# Patient Record
Sex: Female | Born: 1970
Health system: Southern US, Community
[De-identification: ages and names within clinical notes are randomized; demographics above are authoritative.]

## PROBLEM LIST (undated history)

## (undated) DIAGNOSIS — IMO0001 Reserved for inherently not codable concepts without codable children: Secondary | ICD-10-CM

## (undated) DIAGNOSIS — G43909 Migraine, unspecified, not intractable, without status migrainosus: Secondary | ICD-10-CM

## (undated) DIAGNOSIS — R002 Palpitations: Secondary | ICD-10-CM

## (undated) DIAGNOSIS — L7211 Pilar cyst: Secondary | ICD-10-CM

## (undated) DIAGNOSIS — M1990 Unspecified osteoarthritis, unspecified site: Secondary | ICD-10-CM

## (undated) DIAGNOSIS — D259 Leiomyoma of uterus, unspecified: Secondary | ICD-10-CM

## (undated) DIAGNOSIS — N3010 Interstitial cystitis (chronic) without hematuria: Secondary | ICD-10-CM

## (undated) DIAGNOSIS — D649 Anemia, unspecified: Secondary | ICD-10-CM

## (undated) DIAGNOSIS — Z87442 Personal history of urinary calculi: Secondary | ICD-10-CM

## (undated) DIAGNOSIS — E559 Vitamin D deficiency, unspecified: Secondary | ICD-10-CM

## (undated) DIAGNOSIS — I341 Nonrheumatic mitral (valve) prolapse: Principal | ICD-10-CM

## (undated) DIAGNOSIS — E039 Hypothyroidism, unspecified: Secondary | ICD-10-CM

## (undated) DIAGNOSIS — N920 Excessive and frequent menstruation with regular cycle: Secondary | ICD-10-CM

## (undated) HISTORY — DX: Palpitations: R00.2

## (undated) HISTORY — DX: Hypothyroidism, unspecified: E03.9

## (undated) HISTORY — DX: Nonrheumatic mitral (valve) prolapse: I34.1

## (undated) HISTORY — PX: BUNIONECTOMY: SHX129

## (undated) HISTORY — PX: CYSTOSCOPY: SUR368

## (undated) HISTORY — DX: Interstitial cystitis (chronic) without hematuria: N30.10

---

## 1997-06-18 ENCOUNTER — Other Ambulatory Visit: Admission: RE | Admit: 1997-06-18 | Discharge: 1997-06-18 | Payer: Self-pay | Admitting: Obstetrics and Gynecology

## 1999-01-02 ENCOUNTER — Inpatient Hospital Stay (HOSPITAL_COMMUNITY): Admission: AD | Admit: 1999-01-02 | Discharge: 1999-01-05 | Payer: Self-pay | Admitting: Obstetrics and Gynecology

## 1999-02-04 ENCOUNTER — Other Ambulatory Visit: Admission: RE | Admit: 1999-02-04 | Discharge: 1999-02-04 | Payer: Self-pay | Admitting: Obstetrics & Gynecology

## 2000-02-19 ENCOUNTER — Other Ambulatory Visit: Admission: RE | Admit: 2000-02-19 | Discharge: 2000-02-19 | Payer: Self-pay | Admitting: Obstetrics and Gynecology

## 2001-01-14 ENCOUNTER — Other Ambulatory Visit: Admission: RE | Admit: 2001-01-14 | Discharge: 2001-01-14 | Payer: Self-pay | Admitting: Obstetrics & Gynecology

## 2001-08-16 ENCOUNTER — Inpatient Hospital Stay (HOSPITAL_COMMUNITY): Admission: AD | Admit: 2001-08-16 | Discharge: 2001-08-18 | Payer: Self-pay | Admitting: Obstetrics & Gynecology

## 2002-04-20 ENCOUNTER — Other Ambulatory Visit: Admission: RE | Admit: 2002-04-20 | Discharge: 2002-04-20 | Payer: Self-pay | Admitting: Obstetrics & Gynecology

## 2003-04-24 ENCOUNTER — Other Ambulatory Visit: Admission: RE | Admit: 2003-04-24 | Discharge: 2003-04-24 | Payer: Self-pay | Admitting: Obstetrics & Gynecology

## 2004-04-29 ENCOUNTER — Other Ambulatory Visit: Admission: RE | Admit: 2004-04-29 | Discharge: 2004-04-29 | Payer: Self-pay | Admitting: Obstetrics & Gynecology

## 2008-02-03 HISTORY — PX: WISDOM TOOTH EXTRACTION: SHX21

## 2009-10-31 ENCOUNTER — Ambulatory Visit (HOSPITAL_BASED_OUTPATIENT_CLINIC_OR_DEPARTMENT_OTHER): Admission: RE | Admit: 2009-10-31 | Discharge: 2009-10-31 | Payer: Self-pay | Admitting: Urology

## 2010-02-01 ENCOUNTER — Emergency Department (HOSPITAL_BASED_OUTPATIENT_CLINIC_OR_DEPARTMENT_OTHER)
Admission: EM | Admit: 2010-02-01 | Discharge: 2010-02-02 | Payer: Self-pay | Source: Home / Self Care | Admitting: Emergency Medicine

## 2010-02-02 HISTORY — PX: FINGER SURGERY: SHX640

## 2010-06-20 NOTE — Op Note (Signed)
[facility]  Patient:    Theresa Schwartz, Theresa Schwartz Visit Number: [id number] MRN: [id number]          Service Type: OBS Location: 910A 9140 01 Attending Physician:  Mickle Mallory Dictated by:   Gerrit Friends. Aldona Bar, M.D. Proc. Date: 08/16/01 Admit Date:  08/16/2001 Discharge Date: 08/18/2001                             Operative Report  PREOPERATIVE DIAGNOSES:       Repeat cesarean section at term.  POSTOPERATIVE DIAGNOSES:      Repeat cesarean section at term, delivery of 7 pound 9 ounce female infant, Apgars 8 and 9.  PROCEDURE:                    Repeat low transverse cesarean section.  SURGEON:                      Gerrit Friends. Aldona Bar, M.D.  ANESTHESIA:                   Subarachnoid block--Dr. Harvest Forest.  HISTORY:                      This 40 year old gravida 2, para 1 at term is now being taken to the operating room for a repeat cesarean section having had her first cesarean section over two years ago.  A recent ultrasound showed a fetal weight of approximately 7.5-8 pounds with increased amniotic fluid volume and at least one nuchal cord.  PROCEDURE:                    The patient was taken to the operating room where after the satisfactory induction of a subarachnoid block by Dr. Harvest Forest she was prepped and draped having been placed in the supine position slightly tilted to the left.  A Foley catheter was inserted as part of the prep.  After she was adequately draped, anesthetic levels were checked and found to be adequate.  At this time procedure was begun.  A Pfannenstiel incision was made, dissecting down to and through the old scar with ease.  This was carried down to and through the fascia in a low transverse fashion as well with hemostasis created at each layer.  Subfascial space was created inferiorly and superiorly.  Muscles separated in the midline.  Peritoneum identified and entered appropriately with care taken to avoid the bowel superiorly  and the bladder inferiorly.  At this time the vesicouterine peritoneum was incised in a low transverse fashion and pushed off the lower uterine segment with ease.  Sharp incision to the uterus with the Metzenbaum scissors was then carried out and extended with the fingers laterally.  At this time the amniotomy was produced with the production of a voluminous amount of clear amniotic fluid.  Thereafter with the aid of the vacuum extractor a viable female infant weighing 7 pounds 9 ounces was delivered from a vertex position.  After the cord was clamped and cut the infant was passed off the awaiting team.  Subsequent Apgars noted to be 8 and 9 and the infant was taken to the nursery in good condition.  After the cord bloods were collected the placenta was delivered intact.  The uterus was left in and good uterine contractility was afford with slowly given intravenous Pitocin and manual stimulation.  The cavity was  rendered free of any remaining products of conception.  At this time the uterine incision was closed with a single layer of #1 Vicryl in a running locking fashion.  This was reinforced with several figure-of-eight #1 Vicryl.  Hemostasis noted to be adequate.  With the uterus well contracted and the uterine incision dry and all counts being correct and no foreign bodies noted to be remaining in the abdominal cavity, the abdomen at this time was closed in layers.  The abdominoperitoneum was closed with 0 Vicryl in a running fashion and muscles secured with same.  Assured of good subfascial hemostasis, the fascia was then reapproximated with 0 Vicryl from angle to midline bilaterally.  Assured of good subcutaneous hemostasis, the skin was then closed with staples and a sterile pressure dressing was applied.  The patient was transported to the recovery room in satisfactory condition having tolerated procedure well. Estimated blood loss 500 cc.  All counts correct x2.  At the conclusion  of the procedure both mother and baby were doing well in their respective recovery areas. Dictated by:   Gerrit Friends. Aldona Bar, M.D. Attending Physician:  Mickle Mallory DD:  08/16/01 TD:  08/18/01 Job: [id number]

## 2010-06-20 NOTE — Op Note (Signed)
[facility]  Patient:    Theresa Schwartz                        MRN: [id number] Proc. Date: 01/02/99 Adm. Date:  [id number] Attending:  Mickle Mallory                           Operative Report  PREOPERATIVE DIAGNOSIS:       Nonreassuring fetal heart rate tracing.  POSTOPERATIVE DIAGNOSIS:      Nonreassuring fetal heart rate tracing.  Plus delivery of 8 pound 5 ounce female infant, Apgars 7 and 9.  Arterial cord pH 7.21.  OPERATION:                    Primary low transverse cesarean section.  SURGEON:                      Gerrit Friends. Aldona Bar, M.D.  ASSISTANT:  ANESTHESIA:                   Epidural.  ESTIMATED BLOOD LOSS:  INDICATIONS:                  This 40 year old primigravida was admitted at term in labor with rupture of membranes.  When she was admitted around noon on November 30, she was approximately 6 cm dilated and in good active labor.  When I checked her for the first time at approximately 12:30 p.m. she was 7 cm with a vertex at -2  station and the vertex did not fit the cervix well.  Bag of forewaters was ruptured with production of clear fluid and fetal heart rate at this time was noted to be reactive.  The patient did request an epidural which was placed later in the afternoon.  Because of what appeared to be a dysfunctional pattern once the patient became 8 cm and remained at 8 cm for approximately an hour or so, Pitocin augmentation was added.  I was called at approximately 4:35 and notified that there was a decelleration in the fetal heart rate into the 60s and was asked to come o labor and delivery stat.  When I arrived the fetal heart rate had rebounded into the 140 range.  My vaginal examination found the patient to be approximately 8 m dilated with a vertex again at -2 station and again the vertex not fitting the cervix well.  The fetal heart rate at this time had returned to the 140 level.  Because of the  nonreassuring fetal heart change likely resulting from a cord and the fact that the patient was not progressing well and likewise was having an arrest disorder of both descent and dilatation, the decision was made to proceed expediently to the operating room for delivery.  DESCRIPTION OF PROCEDURE:     The patient was taken to the operating room where her epidural was augmented.  Fetal heart rate remained between 150 and 160.  A Foley catheter had been inserted prior to her arrival in the operating room and initially appeared to have blood-tinged urine within.  Nonetheless, after the patient was  prepped and draped with good anesthesia on board, the procedure was begun.  A Pfannenstiel incision was made and with minimal difficulty dissected down sharply to and through the fascia with ease with hemostasis created at each layer.  The  fascia was incised in  a low transverse fashion and thereafter the subfascial space was created inferiorly and superiorly, the muscles separated in the midline, the peritoneum identified and opened appropriately with care taken to avoid the bowel superiorly and the bladder inferiorly.  At this time, the vesicouterine peritoneum was incised in a low transverse fashion and the bladder pushed off the lower uterine segment with ease.  Sharp incision to the uterus with the Metzenbaum scissors was then carried out in a low transverse fashion and extended laterally with the fingers.  With the aid of a vacuum extractor, a viable female infant was  then delivered from a vertex position.  There was a nuchal cord.  The infant cried spontaneously upon delivery.  The infant was handed off to the awaiting team headed up by [place] Sink Q. Mikle Bosworth, M.D. after the cord was clamped and cut.  Cord blood was ent and returned at 7.21 and Apgars were noted to be 7 and 9.  The infant was taken to the nursery in good condition.  After the cord bloods were collected, the placenta  was delivered intact.  At this time the uterus was delivered from the abdominal incision and rendered free of ny remaining products of conception and good contractility afforded with slowly-given intravenous Pitocin and manual stimulation.  At this time the uterine incision as closed with a single layer of #1 Vicryl in a running locking fashion and reinforced with several figure-of-eight #1 Vicryl sutures for additional hemostasis.  The uterine incision was then noted to be hemostatic.  The tubes and ovaries were noted to be normal.  The uterus at this time was replaced into the abdomen after the abdomen was lavaged of all free blood and clot.  At this time closure of the abdomen was begun in layers after being assured that the counts were correct and no foreign bodies were noted to be remaining in the abdominal cavity.  The abdominal peritoneum was closed with 0 Vicryl in a running fashion and the muscles secured with the same.  Assured of good subfascial hemostasis, the fascia was then reapproximated using 0 Vicryl from angle to midline bilaterally.  The subcu tissues were then rendered hemostatic and staples were then used to close the skin.  A sterile pressure dressing was applied and the patient thereafter taken to the recovery room in satisfactory condition having tolerated the procedure well. Estimated blood loss was 500 cc.  Sponge, needle, and instrument counts were correct x 2.  The patients urine was clearing on arrival in the recovery room.  On the patients arrival in the recovery room, both she and baby were doing well in their respective areas.  In summary, this patient who probably was going to develop an arrest disorder of descent and dilatation actually developed a nonreassuring fetal heart rate tracing probably secondary to a nuchal cord and was taken to the operating room for low  transverse cesarean section with delivery of an 8 pound 5 ounce female  infant with Apgars of 7 and 9 and arterial cord pH 7.21. DD:  01/02/99 TD:  01/04/99 Job: [id number]

## 2010-06-20 NOTE — Discharge Summary (Signed)
[facility]  Patient:    Theresa Schwartz, Theresa Schwartz Visit Number: [id number] MRN: [id number]          Service Type: OBS Location: 910A 9140 01 Attending Physician:  Mickle Mallory Dictated by:   Gerrit Friends. Aldona Bar, M.D. Admit Date:  08/16/2001 Discharge Date: 08/18/2001                             Discharge Summary  DISCHARGE DIAGNOSES: 1. Term pregnancy delivered, 7 pound 9 ounce female infant, Apgars 8 and 9. 2. Blood type O+.  PROCEDURES:  Repeat cesarean section at term.  HOSPITAL COURSE:  This 40 year old gravida 2, para 1, underwent a repeat cesarean section at term on 08/16/01, with delivery of a 7 pound 9 ounce female infant with Apgars of 8 and 9.  Her postoperative/postpartum course was benign.  Her discharge hemoglobin was 11.1 with a white blood cell count of 9600, and a platelet count of 214,000.  On the morning of 08/18/01, she was desirous of discharge.  Her wound was clean and dry.  Her staples were removed, and wound was steri-striped.  She was ambulating well, tolerating a regular diet well, having normal bowel and bladder function, was afebrile, vital signs were stable, and her breast-feeding was going well.  Accordingly, she was given all appropriate instructions, and understood all instructions well.  DISCHARGE MEDICATIONS: 1. Vitamins one q.d. as long as she is breast-feeding. 2. Motrin 600 mg q.6h. p.r.n. pain. 3. Tylox one or two q.4-6h. p.r.n. severe pain.  FOLLOWUP:  In approximately four weeks in the office.  CONDITION ON DISCHARGE:  Improved. Dictated by:   Gerrit Friends. Aldona Bar, M.D. Attending Physician:  Mickle Mallory DD:  08/18/01 TD:  08/22/01 Job: [id number]

## 2010-07-08 ENCOUNTER — Other Ambulatory Visit: Payer: Self-pay | Admitting: Gastroenterology

## 2010-07-08 ENCOUNTER — Ambulatory Visit
Admission: RE | Admit: 2010-07-08 | Discharge: 2010-07-08 | Disposition: A | Discharge status: Home / Self Care | Payer: BC Managed Care – PPO | Source: Ambulatory Visit | Attending: Gastroenterology | Admitting: Gastroenterology

## 2010-07-08 DIAGNOSIS — R1011 Right upper quadrant pain: Secondary | ICD-10-CM

## 2010-07-15 ENCOUNTER — Ambulatory Visit
Admission: RE | Admit: 2010-07-15 | Discharge: 2010-07-15 | Disposition: A | Discharge status: Home / Self Care | Payer: BC Managed Care – PPO | Source: Ambulatory Visit | Attending: Gastroenterology | Admitting: Gastroenterology

## 2010-07-15 ENCOUNTER — Other Ambulatory Visit: Payer: Self-pay | Admitting: Gastroenterology

## 2012-12-26 ENCOUNTER — Encounter: Payer: Self-pay | Admitting: Interventional Cardiology

## 2012-12-26 ENCOUNTER — Ambulatory Visit (INDEPENDENT_AMBULATORY_CARE_PROVIDER_SITE_OTHER): Payer: BC Managed Care – PPO | Admitting: Interventional Cardiology

## 2012-12-26 VITALS — BP 120/80 | HR 78 | Ht 64.0 in | Wt 159.0 lb

## 2012-12-26 DIAGNOSIS — N3010 Interstitial cystitis (chronic) without hematuria: Secondary | ICD-10-CM

## 2012-12-26 DIAGNOSIS — R002 Palpitations: Secondary | ICD-10-CM | POA: Insufficient documentation

## 2012-12-26 DIAGNOSIS — E039 Hypothyroidism, unspecified: Secondary | ICD-10-CM

## 2012-12-26 DIAGNOSIS — I059 Rheumatic mitral valve disease, unspecified: Secondary | ICD-10-CM

## 2012-12-26 DIAGNOSIS — I341 Nonrheumatic mitral (valve) prolapse: Secondary | ICD-10-CM

## 2012-12-26 HISTORY — DX: Palpitations: R00.2

## 2012-12-26 HISTORY — DX: Interstitial cystitis (chronic) without hematuria: N30.10

## 2012-12-26 HISTORY — DX: Hypothyroidism, unspecified: E03.9

## 2012-12-26 HISTORY — DX: Nonrheumatic mitral (valve) prolapse: I34.1

## 2012-12-26 NOTE — Patient Instructions (Signed)
Your physician recommends that you continue on your current medications as directed. Please refer to the Current Medication list given to you today.  Your physician wants you to follow-up in: 6 months. You will receive a reminder letter in the mail two months in advance. If you don't receive a letter, please call our office to schedule the follow-up appointment.  

## 2012-12-26 NOTE — Progress Notes (Signed)
Patient ID: Theresa Schwartz, female   DOB: 1970-12-02, 42 y.o.   MRN: [id number]    [address], [address] [place], [place]  [address] Phone: [phone] Fax:  [phone]  Date:  12/26/2012   ID:  Theresa Schwartz, DOB 1970/07/06, MRN [id number]  PCP:  Dr. Carol Ada   ASSESSMENT:  1. Mitral valve prolapse, syndrome with intermittent palpitations, chest pain, fatigue, and other complaints 2. Palpitations, previously documented to be bursts of SVT 3. Hypothyroidism 4. History of interstitial cystitis  PLAN:  1. We discussed the mitral valve prolapse syndrome. We discussed management of hypersensitivity to catecholamines. She uses the beta blocker sparingly. I would not use sedation. I encouraged aerobic activity. No specific further workup at this time. 2. Six-month clinical followup   SUBJECTIVE: Theresa Schwartz is a 42 y.o. female who has a history of "mitral valve prolapse". Multiple echoes by Dr. Luberta Robertson in [facility] has documented the presence of this lesion. There is apparent mild mitral regurgitation. Approximately 4 years ago the diagnosis became known upon evaluation of chest pain, palpitations, and fatigue. She was given low-dose beta blocker therapy which is used intermittently since that time. At least 3 echocardiograms have been performed and have been interpreted to show mild mitral valve prolapse with mild regurgitation. According to the patient she is normal long and been relatively asymptomatic until 2 weeks ago when she suddenly developed a sense of increased palpitations, heart pounding, left chest pain for which she has had prior evaluation, and chest pressure. The pressure in her chest lasted 2 days and has subsequently resolved. She was active during this time. There was some shortness of breath. Minimal activity causes an increased awareness of her heart beating. She has not had sustained tachycardia, lightheadedness, dizziness, or syncope. There is no orthopnea or  PND.   Wt Readings from Last 3 Encounters:  12/26/12 159 lb (72.122 kg)     Past Medical History  Diagnosis Date  . Interstitial cystitis 12/26/2012    Followed by Dr. Jacquelyne Balint  . Hypothyroidism 12/26/2012    replacement therapy  . Mitral valve prolapse syndrome 12/26/2012    Current Outpatient Prescriptions  Medication Sig Dispense Refill  . levothyroxine (SYNTHROID, LEVOTHROID) 25 MCG tablet Take 25 mcg by mouth daily.      . metoprolol tartrate (LOPRESSOR) 25 MG tablet Take 25 mg by mouth as needed.       No current facility-administered medications for this visit.    Allergies:    Allergies  Allergen Reactions  . Sulfa Antibiotics     Social History:  The patient  reports that she has quit smoking. She does not have any smokeless tobacco history on file. She reports that she does not drink alcohol or use illicit drugs.   ROS:  Please see the history of present illness.   Anxiety, difficulty sleeping, there is neurological complaints including leg weakness arm weakness and other. Sedentary lifestyle.   All other systems reviewed and negative.   OBJECTIVE: VS:  BP 120/80  Pulse 78  Ht 5\' 4"  (1.626 m)  Wt 159 lb (72.122 kg)  BMI 27.28 kg/m2 Well nourished, well developed, in no acute distress, mildly overweight HEENT: normal Neck: JVD flat. Carotid bruit absent  Cardiac:  normal S1, S2; RRR; no murmur Lungs:  clear to auscultation bilaterally, no wheezing, rhonchi or rales Abd: soft, nontender, no hepatomegaly Ext: Edema absent. Pulses 2+ Skin: warm and dry Neuro:  CNs 2-12 intact, no focal abnormalities  noted  EKG:  Normal sinus rhythm with insignificant inferior Q waves, vertical axis, and nonspecific interventricular conduction delay       Signed, Darci Needle III, MD 12/26/2012 10:54 AM

## 2013-02-20 ENCOUNTER — Ambulatory Visit
Admission: RE | Admit: 2013-02-20 | Discharge: 2013-02-20 | Disposition: A | Discharge status: Home / Self Care | Payer: BC Managed Care – PPO | Source: Ambulatory Visit | Attending: Internal Medicine | Admitting: Internal Medicine

## 2013-02-20 ENCOUNTER — Other Ambulatory Visit: Payer: Self-pay | Admitting: Internal Medicine

## 2013-02-20 DIAGNOSIS — M5412 Radiculopathy, cervical region: Secondary | ICD-10-CM

## 2013-03-18 ENCOUNTER — Emergency Department (HOSPITAL_BASED_OUTPATIENT_CLINIC_OR_DEPARTMENT_OTHER)
Admission: EM | Admit: 2013-03-18 | Discharge: 2013-03-18 | Disposition: A | Discharge status: Home / Self Care | Payer: BC Managed Care – PPO | Attending: Emergency Medicine | Admitting: Emergency Medicine

## 2013-03-18 ENCOUNTER — Encounter (HOSPITAL_BASED_OUTPATIENT_CLINIC_OR_DEPARTMENT_OTHER): Payer: Self-pay | Admitting: Emergency Medicine

## 2013-03-18 ENCOUNTER — Emergency Department (HOSPITAL_BASED_OUTPATIENT_CLINIC_OR_DEPARTMENT_OTHER): Payer: BC Managed Care – PPO

## 2013-03-18 DIAGNOSIS — E039 Hypothyroidism, unspecified: Secondary | ICD-10-CM | POA: Insufficient documentation

## 2013-03-18 DIAGNOSIS — R110 Nausea: Secondary | ICD-10-CM | POA: Insufficient documentation

## 2013-03-18 DIAGNOSIS — Z8679 Personal history of other diseases of the circulatory system: Secondary | ICD-10-CM | POA: Insufficient documentation

## 2013-03-18 DIAGNOSIS — R350 Frequency of micturition: Secondary | ICD-10-CM | POA: Insufficient documentation

## 2013-03-18 DIAGNOSIS — R109 Unspecified abdominal pain: Secondary | ICD-10-CM | POA: Insufficient documentation

## 2013-03-18 DIAGNOSIS — Z87891 Personal history of nicotine dependence: Secondary | ICD-10-CM | POA: Insufficient documentation

## 2013-03-18 DIAGNOSIS — Z3202 Encounter for pregnancy test, result negative: Secondary | ICD-10-CM | POA: Insufficient documentation

## 2013-03-18 DIAGNOSIS — M549 Dorsalgia, unspecified: Secondary | ICD-10-CM | POA: Insufficient documentation

## 2013-03-18 DIAGNOSIS — R319 Hematuria, unspecified: Secondary | ICD-10-CM | POA: Insufficient documentation

## 2013-03-18 DIAGNOSIS — Z79899 Other long term (current) drug therapy: Secondary | ICD-10-CM | POA: Insufficient documentation

## 2013-03-18 DIAGNOSIS — R3915 Urgency of urination: Secondary | ICD-10-CM | POA: Insufficient documentation

## 2013-03-18 HISTORY — DX: Reserved for inherently not codable concepts without codable children: IMO0001

## 2013-03-18 LAB — BASIC METABOLIC PANEL
BUN: 21 mg/dL (ref 6–23)
CALCIUM: 9.3 mg/dL (ref 8.4–10.5)
CO2: 25 meq/L (ref 19–32)
Chloride: 102 mEq/L (ref 96–112)
Creatinine, Ser: 0.6 mg/dL (ref 0.50–1.10)
GFR calc Af Amer: >90 mL/min (ref >90)
GFR calc non Af Amer: >90 mL/min (ref >90)
GLUCOSE: 91 mg/dL (ref 70–99)
Potassium: 4.1 mEq/L (ref 3.7–5.3)
SODIUM: 140 meq/L (ref 137–147)

## 2013-03-18 LAB — CBC WITH DIFFERENTIAL/PLATELET
Basophils Absolute: 0 10*3/uL (ref 0.0–0.1)
Basophils Relative: 0 % (ref 0–1)
EOS ABS: 0.3 10*3/uL (ref 0.0–0.7)
EOS PCT: 3 % (ref 0–5)
HEMATOCRIT: 41.4 % (ref 36.0–46.0)
HEMOGLOBIN: 14.3 g/dL (ref 12.0–15.0)
LYMPHS ABS: 2.3 10*3/uL (ref 0.7–4.0)
LYMPHS PCT: 26 % (ref 12–46)
MCH: 31.9 pg (ref 26.0–34.0)
MCHC: 34.5 g/dL (ref 30.0–36.0)
MCV: 92.4 fL (ref 78.0–100.0)
MONOS PCT: 7 % (ref 3–12)
Monocytes Absolute: 0.6 10*3/uL (ref 0.1–1.0)
Neutro Abs: 5.5 10*3/uL (ref 1.7–7.7)
Neutrophils Relative %: 64 % (ref 43–77)
PLATELETS: 309 10*3/uL (ref 150–400)
RBC: 4.48 MIL/uL (ref 3.87–5.11)
RDW: 12.1 % (ref 11.5–15.5)
WBC: 8.7 10*3/uL (ref 4.0–10.5)

## 2013-03-18 LAB — URINALYSIS, ROUTINE W REFLEX MICROSCOPIC
BILIRUBIN URINE: NEGATIVE
Glucose, UA: NEGATIVE mg/dL
KETONES UR: NEGATIVE mg/dL
Leukocytes, UA: NEGATIVE
NITRITE: NEGATIVE
PROTEIN: NEGATIVE mg/dL
Specific Gravity, Urine: 1.008 (ref 1.005–1.030)
UROBILINOGEN UA: 0.2 mg/dL (ref 0.0–1.0)
pH: 7 (ref 5.0–8.0)

## 2013-03-18 LAB — URINE MICROSCOPIC-ADD ON

## 2013-03-18 LAB — PREGNANCY, URINE: Preg Test, Ur: NEGATIVE

## 2013-03-18 MED ORDER — NITROFURANTOIN MONOHYD MACRO 100 MG PO CAPS: 100.0000 mg | ORAL_CAPSULE | Freq: Two times a day (BID) | ORAL | Status: DC

## 2013-03-18 MED ORDER — KETOROLAC TROMETHAMINE 30 MG/ML IJ SOLN
30.0000 mg | Freq: Once | INTRAMUSCULAR | Status: AC
  Administered 2013-03-18: 30 mg via INTRAVENOUS
  Filled 2013-03-18: qty 1

## 2013-03-18 MED ORDER — SODIUM CHLORIDE 0.9 % IV SOLN
INTRAVENOUS | Status: AC
  Administered 2013-03-18: 15:00:00 via INTRAVENOUS

## 2013-03-18 MED ORDER — HYDROCODONE-ACETAMINOPHEN 5-325 MG PO TABS: 1.0000 | ORAL_TABLET | ORAL | Status: DC | PRN

## 2013-03-18 MED ORDER — ONDANSETRON HCL 4 MG/2ML IJ SOLN
4.0000 mg | Freq: Once | INTRAMUSCULAR | Status: AC
  Administered 2013-03-18: 4 mg via INTRAVENOUS
  Filled 2013-03-18: qty 2

## 2013-03-18 MED ORDER — PHENAZOPYRIDINE HCL 200 MG PO TABS: 200.0000 mg | ORAL_TABLET | Freq: Three times a day (TID) | ORAL | Status: DC

## 2013-03-18 NOTE — Discharge Instructions (Signed)
Your CT scan today does not show a kidney stone. You may have passed a stone or they may be other reasons for the blood in your urine. Your blood work today is normal. We have sent your urine for culture to be sure there is no hidden infection. We are giving you the name of a kidney doctor to follow up with.  Return here as needed.

## 2013-03-18 NOTE — ED Notes (Signed)
C/o bilateral flank pain, onset on Wednesday.  Pain radiates around to her mid abdomen.  C/o nausea, denies vomiting/diarrhea, fever.  Seen at [facility] last night, they referred her for continued eval if pain continued.

## 2013-03-18 NOTE — ED Provider Notes (Signed)
CSN: [id number]     Arrival date & time 03/18/13  73 History   First MD Initiated Contact with Patient 03/18/13 1426     Chief Complaint  Patient presents with  . Flank Pain     (Consider location/radiation/quality/duration/timing/severity/associated sxs/prior Treatment) Patient is a 43 y.o. female presenting with flank pain. The history is provided by the patient.  Flank Pain This is a new problem. The current episode started in the past 7 days. The problem has been gradually worsening. Associated symptoms include abdominal pain and nausea. Pertinent negatives include no chills, fever, headaches, rash or vomiting. She has tried nothing for the symptoms.   LAGENA STRAND is a 43 y.o. female who presents to the ED with bilateral flank pain that started 4 days ago. The pain radiates from the lower back to the right lower abdomen. She complains of nausea but no vomiting or diarrhea and no fever. She went to [facility] last night and they suggested she come to the ED for further evaluation of possible kidney stone if the pain continued. The pain has continued. PMH significant for interstitial cystitis.    Past Medical History  Diagnosis Date  . Interstitial cystitis 12/26/2012    Followed by Dr. Vikki Ports  . Hypothyroidism 12/26/2012    replacement therapy  . Mitral valve prolapse syndrome 12/26/2012  . Other physical therapy     for loss of curvature in her cervical spine   Past Surgical History  Procedure Laterality Date  . Cesarean section    . Cystoscopy     Family History  Problem Relation Age of Onset  . Hypertension Father    History  Substance Use Topics  . Smoking status: Former Research scientist (life sciences)  . Smokeless tobacco: Not on file  . Alcohol Use: No   OB History   Grav Para Term Preterm Abortions TAB SAB Ect Mult Living                 Review of Systems  Constitutional: Negative for fever and chills.  HENT: Negative.   Eyes: Negative for visual disturbance.   Respiratory: Negative for shortness of breath.   Gastrointestinal: Positive for nausea and abdominal pain. Negative for vomiting.  Genitourinary: Positive for urgency, frequency and flank pain. Negative for dysuria.  Musculoskeletal: Positive for back pain.  Skin: Negative for rash.  Neurological: Negative for syncope and headaches.  Psychiatric/Behavioral: The patient is not nervous/anxious.       Allergies  Cymbalta; Sulfa antibiotics; and Tramadol  Home Medications   Current Outpatient Rx  Name  Route  Sig  Dispense  Refill  . levothyroxine (SYNTHROID, LEVOTHROID) 25 MCG tablet   Oral   Take 25 mcg by mouth daily.         . metoprolol tartrate (LOPRESSOR) 25 MG tablet   Oral   Take 25 mg by mouth as needed.          BP 120/82  Pulse 100  Temp(Src) 98.5 F (36.9 C) (Oral)  Resp 12  Ht 5\' 3"  (1.6 m)  Wt 160 lb (72.576 kg)  BMI 28.35 kg/m2  SpO2 99%  LMP 03/09/2013 Physical Exam  Nursing note and vitals reviewed. Constitutional: She is oriented to person, place, and time. She appears well-developed and well-nourished. No distress.  HENT:  Head: Normocephalic and atraumatic.  Eyes: Conjunctivae and EOM are normal.  Neck: Neck supple.  Cardiovascular: Normal rate.   Pulmonary/Chest: Effort normal.  Abdominal: Soft. Bowel sounds are normal. There is  tenderness in the suprapubic area. There is no rebound, no guarding and no CVA tenderness.  Musculoskeletal: Normal range of motion.  Neurological: She is alert and oriented to person, place, and time. No cranial nerve deficit.  Skin: Skin is warm and dry.  Psychiatric: She has a normal mood and affect. Her behavior is normal.    ED Course  Procedures  Results for orders placed during the hospital encounter of 03/18/13 (from the past 24 hour(s))  URINALYSIS, ROUTINE W REFLEX MICROSCOPIC     Status: Abnormal   Collection Time    03/18/13  1:26 PM      Result Value Ref Range   Color, Urine YELLOW  YELLOW    APPearance CLEAR  CLEAR   Specific Gravity, Urine 1.008  1.005 - 1.030   pH 7.0  5.0 - 8.0   Glucose, UA NEGATIVE  NEGATIVE mg/dL   Hgb urine dipstick MODERATE (*) NEGATIVE   Bilirubin Urine NEGATIVE  NEGATIVE   Ketones, ur NEGATIVE  NEGATIVE mg/dL   Protein, ur NEGATIVE  NEGATIVE mg/dL   Urobilinogen, UA 0.2  0.0 - 1.0 mg/dL   Nitrite NEGATIVE  NEGATIVE   Leukocytes, UA NEGATIVE  NEGATIVE  PREGNANCY, URINE     Status: None   Collection Time    03/18/13  1:26 PM      Result Value Ref Range   Preg Test, Ur NEGATIVE  NEGATIVE  URINE MICROSCOPIC-ADD ON     Status: Abnormal   Collection Time    03/18/13  1:26 PM      Result Value Ref Range   Squamous Epithelial / LPF RARE  RARE   RBC / HPF 3-6  <3 RBC/hpf   Bacteria, UA FEW (*) RARE  CBC WITH DIFFERENTIAL     Status: None   Collection Time    03/18/13  3:15 PM      Result Value Ref Range   WBC 8.7  4.0 - 10.5 K/uL   RBC 4.48  3.87 - 5.11 MIL/uL   Hemoglobin 14.3  12.0 - 15.0 g/dL   HCT 41.4  36.0 - 46.0 %   MCV 92.4  78.0 - 100.0 fL   MCH 31.9  26.0 - 34.0 pg   MCHC 34.5  30.0 - 36.0 g/dL   RDW 12.1  11.5 - 15.5 %   Platelets 309  150 - 400 K/uL   Neutrophils Relative % 64  43 - 77 %   Neutro Abs 5.5  1.7 - 7.7 K/uL   Lymphocytes Relative 26  12 - 46 %   Lymphs Abs 2.3  0.7 - 4.0 K/uL   Monocytes Relative 7  3 - 12 %   Monocytes Absolute 0.6  0.1 - 1.0 K/uL   Eosinophils Relative 3  0 - 5 %   Eosinophils Absolute 0.3  0.0 - 0.7 K/uL   Basophils Relative 0  0 - 1 %   Basophils Absolute 0.0  0.0 - 0.1 K/uL  BASIC METABOLIC PANEL     Status: None   Collection Time    03/18/13  3:15 PM      Result Value Ref Range   Sodium 140  137 - 147 mEq/L   Potassium 4.1  3.7 - 5.3 mEq/L   Chloride 102  96 - 112 mEq/L   CO2 25  19 - 32 mEq/L   Glucose, Bld 91  70 - 99 mg/dL   BUN 21  6 - 23 mg/dL   Creatinine, Ser 0.60  0.50 - 1.10 mg/dL   Calcium 9.3  8.4 - 10.5 mg/dL   GFR calc non Af Amer >90  >90 mL/min   GFR calc Af Amer  >90  >90 mL/min    Ct Abdomen Pelvis Wo Contrast  03/18/2013   CLINICAL DATA:  Bilateral flank pain, nausea and hematuria.  EXAM: CT ABDOMEN AND PELVIS WITHOUT CONTRAST  TECHNIQUE: Multidetector CT imaging of the abdomen and pelvis was performed following the standard protocol without IV contrast.  COMPARISON:  CT ABD/PELV WO CM dated 07/15/2010; CT ABD-PELV WO/W CM dated 10/09/2009  FINDINGS: No renal or ureteral calculi identified. There is no evidence of hydronephrosis. The bladder is completely decompressed.  Unenhanced appearance of the liver, gallbladder, spleen, pancreas, adrenal glands and bowel are unremarkable. The uterus and adnexal regions are unremarkable in the pelvis.  No incidental masses, enlarged lymph nodes or abnormal fluid collections are seen. No hernias are identified. Bony structures are unremarkable.  IMPRESSION: No acute findings.  No evidence of renal or ureteral calculi.   Electronically Signed   By: Aletta Edouard M.D.   On: 03/18/2013 15:28    Patient states she feels the need to void every 5 minutes and feels like she does when she is getting a UTI. She request antibiotic while UC pending.  MDM  43 y.o. female with hematuria and UTI symptoms. Will treat with Macrobid while culture pending. She will make an appointment with her urologist for follow up. She will return here as needed for problems.  I have reviewed this patient's vital signs, nurses notes, appropriate labs and imaging.  I have discussed findings and plan of care with the patient. She agrees to plan.    Medication List    TAKE these medications       HYDROcodone-acetaminophen 5-325 MG per tablet  Commonly known as:  NORCO/VICODIN  Take 1 tablet by mouth every 4 (four) hours as needed.     nitrofurantoin (macrocrystal-monohydrate) 100 MG capsule  Commonly known as:  MACROBID  Take 1 capsule (100 mg total) by mouth 2 (two) times daily.     phenazopyridine 200 MG tablet  Commonly known as:  PYRIDIUM   Take 1 tablet (200 mg total) by mouth 3 (three) times daily.      ASK your doctor about these medications       levothyroxine 25 MCG tablet  Commonly known as:  SYNTHROID, LEVOTHROID  Take 25 mcg by mouth daily.     metoprolol tartrate 25 MG tablet  Commonly known as:  LOPRESSOR  Take 25 mg by mouth as needed.           [facility] Bunnie Pion, [place] 03/19/13 [phone]

## 2013-03-19 NOTE — ED Provider Notes (Signed)
Medical screening examination/treatment/procedure(s) were performed by non-physician practitioner and as supervising physician I was immediately available for consultation/collaboration.  EKG Interpretation   None         Yeny Schmoll B. Jaishawn Witzke, MD 03/19/13 1305 

## 2013-03-20 LAB — URINE CULTURE
CULTURE: NO GROWTH
Colony Count: NO GROWTH

## 2013-03-28 ENCOUNTER — Ambulatory Visit: Payer: BC Managed Care – PPO | Admitting: Interventional Cardiology

## 2013-07-19 ENCOUNTER — Ambulatory Visit (INDEPENDENT_AMBULATORY_CARE_PROVIDER_SITE_OTHER): Payer: BC Managed Care – PPO | Admitting: Interventional Cardiology

## 2013-07-19 ENCOUNTER — Encounter: Payer: Self-pay | Admitting: Interventional Cardiology

## 2013-07-19 VITALS — BP 104/68 | HR 66 | Ht 64.0 in | Wt 161.0 lb

## 2013-07-19 DIAGNOSIS — I341 Nonrheumatic mitral (valve) prolapse: Secondary | ICD-10-CM

## 2013-07-19 DIAGNOSIS — I059 Rheumatic mitral valve disease, unspecified: Secondary | ICD-10-CM

## 2013-07-19 DIAGNOSIS — R002 Palpitations: Secondary | ICD-10-CM

## 2013-07-19 NOTE — Progress Notes (Signed)
Patient ID: Theresa Schwartz, female   DOB: [birth year], 43 y.o.   MRN: [id number]    [address], [address] [place], [place]  [address] Phone: [phone] Fax:  [phone]  Date:  07/19/2013   ID:  Theresa Schwartz, DOB February 12, 1970, MRN [id number]  PCP:  No primary provider on file.   ASSESSMENT:  1. Mitral valve prolapse syndrome, minimally symptomatic  PLAN:  1. Clinical followup in one year   SUBJECTIVE: Theresa Schwartz is a 43 y.o. female who has minimal symptoms. The   Wt Readings from Last 3 Encounters:  07/19/13 161 lb (73.029 kg)  03/18/13 160 lb (72.576 kg)  12/26/12 159 lb (72.122 kg)     Past Medical History  Diagnosis Date  . Interstitial cystitis 12/26/2012    Followed by Dr. Vikki Ports  . Hypothyroidism 12/26/2012    replacement therapy  . Mitral valve prolapse syndrome 12/26/2012  . Other physical therapy     for loss of curvature in her cervical spine    Current Outpatient Prescriptions  Medication Sig Dispense Refill  . levothyroxine (SYNTHROID, LEVOTHROID) 25 MCG tablet Take 25 mcg by mouth daily.      . metoprolol tartrate (LOPRESSOR) 25 MG tablet Take 25 mg by mouth as needed.       No current facility-administered medications for this visit.    Allergies:    Allergies  Allergen Reactions  . Cymbalta [Duloxetine Hcl]   . Sulfa Antibiotics   . Tramadol     Social History:  The patient  reports that she has quit smoking. She does not have any smokeless tobacco history on file. She reports that she does not drink alcohol or use illicit drugs.   ROS:  Please see the history of present illness.   Denies orthopnea   All other systems reviewed and negative.   OBJECTIVE: VS:  BP 104/68  Pulse 66  Ht 5\' 4"  (1.626 m)  Wt 161 lb (73.029 kg)  BMI 27.62 kg/m2 Well nourished, well developed, in no acute distress, healthy-appearing HEENT: normal Neck: JVD flat. No bruits  Cardiac:  normal S1, S2; RRR; no murmur  Lungs:  clear to auscultation  bilaterally, no wheezing, rhonchi or rales Abd: soft, nontender, no hepatomegaly Ext: Edema absent. Pulses 2+ Skin: warm and dry Neuro:  CNs 2-12 intact, no focal abnormalities noted  EKG:  Normal       Signed, Illene Labrador III, MD 07/19/2013 8:38 AM

## 2013-07-19 NOTE — Patient Instructions (Signed)
Your physician recommends that you continue on your current medications as directed. Please refer to the Current Medication list given to you today.  Your physician wants you to follow-up in: 1 year. You will receive a reminder letter in the mail two months in advance. If you don't receive a letter, please call our office to schedule the follow-up appointment.  

## 2014-08-14 ENCOUNTER — Encounter: Payer: Self-pay | Admitting: Interventional Cardiology

## 2014-08-14 ENCOUNTER — Ambulatory Visit (INDEPENDENT_AMBULATORY_CARE_PROVIDER_SITE_OTHER): Payer: BLUE CROSS/BLUE SHIELD | Admitting: Interventional Cardiology

## 2014-08-14 VITALS — BP 112/82 | HR 64 | Ht 64.0 in | Wt 167.2 lb

## 2014-08-14 DIAGNOSIS — R002 Palpitations: Secondary | ICD-10-CM | POA: Diagnosis not present

## 2014-08-14 DIAGNOSIS — I341 Nonrheumatic mitral (valve) prolapse: Secondary | ICD-10-CM

## 2014-08-14 MED ORDER — TRIAMTERENE-HCTZ 37.5-25 MG PO CAPS: 1.0000 | ORAL_CAPSULE | Freq: Every day | ORAL | Status: AC

## 2014-08-14 NOTE — Progress Notes (Signed)
Cardiology Office Note   Date:  08/14/2014   ID:  ELA MOFFAT, DOB [birth year], MRN [id number]  PCP:  Lottie Dawson, MD  Cardiologist:  Sinclair Grooms, MD   Chief Complaint  Patient presents with  . Mitral Valve Prolapse      History of Present Illness: Theresa Schwartz is a 44 y.o. female who presents for  Follow-up of echocardiographic anterior and posterior leaflet mitral prolapse. She has never been noted to have a murmur. Recently she has had difficulty with kidney stones , and fluid retention involving her lower extremities. The edema is cyclical. She uses non-steroidal anti-inflammatory agents.    Past Medical History  Diagnosis Date  . Interstitial cystitis 12/26/2012    Followed by Dr. Vikki Ports  . Hypothyroidism 12/26/2012    replacement therapy  . Mitral valve prolapse syndrome 12/26/2012  . Other physical therapy     for loss of curvature in her cervical spine    Past Surgical History  Procedure Laterality Date  . Cesarean section    . Cystoscopy       Current Outpatient Prescriptions  Medication Sig Dispense Refill  . Cholecalciferol (VITAMIN D3) 1000 UNITS CAPS Take 1,000 Units by mouth daily.    Marland Kitchen levothyroxine (SYNTHROID, LEVOTHROID) 25 MCG tablet Take 25 mcg by mouth daily.    . metoprolol tartrate (LOPRESSOR) 25 MG tablet Take 25 mg by mouth 2 (two) times daily as needed (palpitations).     . triamterene-hydrochlorothiazide (DYAZIDE) 37.5-25 MG per capsule Take 1 each (1 capsule total) by mouth daily.     No current facility-administered medications for this visit.    Allergies:   Cymbalta; Tramadol; and Sulfa antibiotics    Social History:  The patient  reports that she has quit smoking. She has never used smokeless tobacco. She reports that she does not drink alcohol or use illicit drugs.   Family History:  The patient's family history includes Arthritis/Rheumatoid in her father; Healthy in her mother and sister;  Hypertension in her father.    ROS:  Please see the history of present illness.   Otherwise, review of systems are positive for  Abdominal pain and flank pain related to kidney stones. Intermittent lower extremity swelling of uncertain etiology..   All other systems are reviewed and negative.    PHYSICAL EXAM: VS:  BP 112/82 mmHg  Pulse 64  Ht 5\' 4"  (1.626 m)  Wt 75.841 kg (167 lb 3.2 oz)  BMI 28.69 kg/m2 , BMI Body mass index is 28.69 kg/(m^2). GEN: Well nourished, well developed, in no acute distress HEENT: normal Neck: no JVD, carotid bruits, or masses Cardiac: RRR; no murmurs, rubs, or gallops,no edema  Respiratory:  clear to auscultation bilaterally, normal work of breathing GI: soft, nontender, nondistended, + BS MS: no deformity or atrophy Skin: warm and dry, no rash Neuro:  Strength and sensation are intact Psych: euthymic mood, full affect   EKG:  EKG is ordered today. The ekg ordered today demonstrates  Sinus rhythm with normal tracing.   Recent Labs: No results found for requested labs within last 365 days.    Lipid Panel No results found for: CHOL, TRIG, HDL, CHOLHDL, VLDL, LDLCALC, LDLDIRECT    Wt Readings from Last 3 Encounters:  08/14/14 75.841 kg (167 lb 3.2 oz)  07/19/13 73.029 kg (161 lb)  03/18/13 72.576 kg (160 lb)      Other studies Reviewed: Additional studies/ records that were reviewed today include: Review of the  above records demonstrates:    ASSESSMENT AND PLAN:  1. Mitral valve prolapse syndrome  no auscultatory evidence of mitral valve disease  2. Palpitations  Absent    3. Intermittent edema  possibly related to  Non-steroidal anti-inflammatory use and sodium intake.    Current medicines are reviewed at length with the patient today.  The patient has concerns regarding medicines.  The following changes have been made:   Diuretic therapy , Maxide versus hydrochlorothiazide.   Instructed to decrease intake of non-steroidal  anti-inflammatory agents and decrease sodium intake to less than 3000 mg per day.  Labs/ tests ordered today include:   Orders Placed This Encounter  Procedures  . EKG 12-Lead     Disposition:   FU with HS in 1 year  Signed, Sinclair Grooms, MD  08/14/2014 5:40 PM    [facility] [place], [place], [place]  [address] Phone: [phone]; Fax: [phone]

## 2014-08-14 NOTE — Patient Instructions (Signed)
Medication Instructions:  Your physician recommends that you continue on your current medications as directed. Please refer to the Current Medication list given to you today.   Labwork: None   Testing/Procedures: None   Follow-Up: Your physician wants you to follow-up in: 1 year with Dr.Smith You will receive a reminder letter in the mail two months in advance. If you don't receive a letter, please call our office to schedule the follow-up appointment.   Any Other Special Instructions Will Be Listed Below (If Applicable). Limit your sodium intake to 3 grams daily. Stay active

## 2015-05-20 ENCOUNTER — Ambulatory Visit
Admission: RE | Admit: 2015-05-20 | Discharge: 2015-05-20 | Disposition: A | Discharge status: Home / Self Care | Payer: BLUE CROSS/BLUE SHIELD | Source: Ambulatory Visit | Attending: Internal Medicine | Admitting: Internal Medicine

## 2015-05-20 ENCOUNTER — Other Ambulatory Visit: Payer: Self-pay | Admitting: Internal Medicine

## 2015-05-20 DIAGNOSIS — R1031 Right lower quadrant pain: Secondary | ICD-10-CM

## 2015-05-20 MED ORDER — IOPAMIDOL (ISOVUE-300) INJECTION 61%
100.0000 mL | Freq: Once | INTRAVENOUS | Status: AC | PRN
  Administered 2015-05-20: 100 mL via INTRAVENOUS

## 2017-05-06 DIAGNOSIS — M62830 Muscle spasm of back: Secondary | ICD-10-CM | POA: Diagnosis not present

## 2017-05-06 DIAGNOSIS — G43909 Migraine, unspecified, not intractable, without status migrainosus: Secondary | ICD-10-CM | POA: Diagnosis not present

## 2017-05-06 DIAGNOSIS — M62838 Other muscle spasm: Secondary | ICD-10-CM | POA: Diagnosis not present

## 2017-06-08 DIAGNOSIS — M62830 Muscle spasm of back: Secondary | ICD-10-CM | POA: Diagnosis not present
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
## 2017-09-20 ENCOUNTER — Encounter: Payer: Self-pay | Admitting: Interventional Cardiology

## 2017-09-20 ENCOUNTER — Ambulatory Visit: Payer: 59 | Admitting: Interventional Cardiology

## 2017-09-20 VITALS — BP 114/90 | HR 76 | Ht 64.0 in | Wt 166.8 lb

## 2017-09-20 DIAGNOSIS — I341 Nonrheumatic mitral (valve) prolapse: Secondary | ICD-10-CM | POA: Diagnosis not present

## 2017-09-20 DIAGNOSIS — R002 Palpitations: Secondary | ICD-10-CM | POA: Diagnosis not present

## 2017-09-20 DIAGNOSIS — R0789 Other chest pain: Secondary | ICD-10-CM | POA: Diagnosis not present

## 2017-09-20 MED ORDER — METOPROLOL TARTRATE 25 MG PO TABS: 25.0000 mg | ORAL_TABLET | ORAL | 3 refills | Status: DC | PRN

## 2017-09-20 MED ORDER — METOPROLOL SUCCINATE ER 25 MG PO TB24: ORAL_TABLET | ORAL | 3 refills | Status: DC

## 2017-09-20 NOTE — Patient Instructions (Addendum)
Medication Instructions:  1. RX FOR METOPROLOL TARTRATE 25 MG HAS BEEN SENT IN FOR 25 MG TABLET WITH THE DIRECTIONS TO TAKE 1 TABLET AS NEEDED FOR PALPITATIONS  Labwork: NONE ORDERED TODAY  Testing/Procedures: Your physician has requested that you have an echocardiogram. Echocardiography is a painless test that uses sound waves to create images of your heart. It provides your doctor with information about the size and shape of your heart and how well your heart's chambers and valves are working. This procedure takes approximately one hour. There are no restrictions for this procedure.    Follow-Up: 1 YEAR FOLLOW UP WITH DR. Tamala Julian   Any Other Special Instructions Will Be Listed Below (If Applicable).     If you need a refill on your cardiac medications before your next appointment, please call your pharmacy.

## 2017-12-16 ENCOUNTER — Other Ambulatory Visit (HOSPITAL_COMMUNITY): Payer: 59

## 2017-12-17 ENCOUNTER — Ambulatory Visit (HOSPITAL_COMMUNITY): Payer: 59 | Attending: Cardiovascular Disease

## 2017-12-17 ENCOUNTER — Other Ambulatory Visit: Payer: Self-pay

## 2017-12-17 DIAGNOSIS — I341 Nonrheumatic mitral (valve) prolapse: Secondary | ICD-10-CM

## 2017-12-17 DIAGNOSIS — R002 Palpitations: Secondary | ICD-10-CM | POA: Diagnosis not present

## 2018-01-03 DIAGNOSIS — E039 Hypothyroidism, unspecified: Secondary | ICD-10-CM | POA: Diagnosis not present

## 2018-01-03 DIAGNOSIS — M62830 Muscle spasm of back: Secondary | ICD-10-CM | POA: Diagnosis not present

## 2018-01-03 DIAGNOSIS — E785 Hyperlipidemia, unspecified: Secondary | ICD-10-CM | POA: Diagnosis not present

## 2018-01-03 DIAGNOSIS — D509 Iron deficiency anemia, unspecified: Secondary | ICD-10-CM | POA: Diagnosis not present

## 2018-01-03 DIAGNOSIS — Z0000 Encounter for general adult medical examination without abnormal findings: Secondary | ICD-10-CM | POA: Diagnosis not present

## 2018-01-03 DIAGNOSIS — I341 Nonrheumatic mitral (valve) prolapse: Secondary | ICD-10-CM | POA: Diagnosis not present

## 2018-08-11 ENCOUNTER — Emergency Department (HOSPITAL_BASED_OUTPATIENT_CLINIC_OR_DEPARTMENT_OTHER): Payer: 59

## 2018-08-11 ENCOUNTER — Other Ambulatory Visit: Payer: Self-pay

## 2018-08-11 ENCOUNTER — Emergency Department (HOSPITAL_BASED_OUTPATIENT_CLINIC_OR_DEPARTMENT_OTHER)
Admission: EM | Admit: 2018-08-11 | Discharge: 2018-08-11 | Disposition: A | Discharge status: Home / Self Care | Payer: 59 | Attending: Emergency Medicine | Admitting: Emergency Medicine

## 2018-08-11 ENCOUNTER — Encounter (HOSPITAL_BASED_OUTPATIENT_CLINIC_OR_DEPARTMENT_OTHER): Payer: Self-pay | Admitting: *Deleted

## 2018-08-11 DIAGNOSIS — M79661 Pain in right lower leg: Secondary | ICD-10-CM | POA: Diagnosis not present

## 2018-08-11 DIAGNOSIS — M79605 Pain in left leg: Secondary | ICD-10-CM

## 2018-08-11 DIAGNOSIS — Z79899 Other long term (current) drug therapy: Secondary | ICD-10-CM | POA: Insufficient documentation

## 2018-08-11 DIAGNOSIS — E039 Hypothyroidism, unspecified: Secondary | ICD-10-CM | POA: Diagnosis not present

## 2018-08-11 DIAGNOSIS — M79662 Pain in left lower leg: Secondary | ICD-10-CM | POA: Diagnosis not present

## 2018-08-11 DIAGNOSIS — Z87891 Personal history of nicotine dependence: Secondary | ICD-10-CM | POA: Insufficient documentation

## 2018-08-11 MED ORDER — IBUPROFEN 200 MG PO TABS
600.0000 mg | ORAL_TABLET | Freq: Once | ORAL | Status: AC
  Administered 2018-08-11: 600 mg via ORAL
  Filled 2018-08-11: qty 1

## 2018-08-11 NOTE — ED Notes (Signed)
Returned to room.

## 2018-08-11 NOTE — ED Notes (Signed)
ED Provider at bedside. 

## 2018-08-11 NOTE — ED Notes (Signed)
Patient transported to Ultrasound 

## 2018-08-11 NOTE — ED Triage Notes (Signed)
2 days ago she had pain in her left lower leg. Pain has gotten worse over time. Swelling. No injury.

## 2018-08-11 NOTE — ED Provider Notes (Signed)
[facility] Provider Note   CSN: [id number] Arrival date & time: 08/11/18  1512    History   Chief Complaint No chief complaint on file.   HPI Theresa Schwartz is a 48 y.o. female.     HPI Patient presents with left calf swelling and pain which started 2 days ago.  Has gradually worsened.  No known injury.  Denies chest pain or shortness of breath.  Patient is on oral birth control.  No personal family history of DVT.  No extended immobilization Past Medical History:  Diagnosis Date   Hypothyroidism 12/26/2012   replacement therapy   Interstitial cystitis 12/26/2012   Followed by Dr. Vikki Ports   Mitral valve prolapse syndrome 12/26/2012   Other physical therapy    for loss of curvature in her cervical spine   Palpitations 12/26/2012   Previously documented to be related to PAT (very short salvos) and PACs     Patient Active Problem List   Diagnosis Date Noted   Mitral valve prolapse syndrome 12/26/2012    Class: Chronic   Palpitations 12/26/2012    Class: Chronic   Interstitial cystitis 12/26/2012    Past Surgical History:  Procedure Laterality Date   CESAREAN SECTION     CYSTOSCOPY       OB History   No obstetric history on file.      Home Medications    Prior to Admission medications   Medication Sig Start Date End Date Taking? Authorizing Provider  Cholecalciferol (VITAMIN D3) 1000 UNITS CAPS Take 1,000 Units by mouth daily.   Yes [provider]  levothyroxine (SYNTHROID, LEVOTHROID) 25 MCG tablet Take 25 mcg by mouth daily.   Yes [provider]  metoprolol tartrate (LOPRESSOR) 25 MG tablet Take 1 tablet (25 mg total) by mouth as needed (palpitations). 09/20/17  Yes Belva Crome, MD  Norethindrone-Eth Estradiol (ALYACEN 1/35 PO) Take 1 tablet by mouth daily.   Yes [provider]  simvastatin (ZOCOR) 10 MG tablet Take 10 mg by mouth daily.   Yes [provider]    triamterene-hydrochlorothiazide (DYAZIDE) 37.5-25 MG per capsule Take 1 each (1 capsule total) by mouth daily. 08/14/14  Yes Belva Crome, MD  CALCIUM PO Take 600 mg by mouth daily.    [provider]    Family History Family History  Problem Relation Age of Onset   Hypertension Father    Arthritis/Rheumatoid Father    Healthy Mother    Healthy Sister     Social History Social History   Tobacco Use   Smoking status: Former Smoker   Smokeless tobacco: Never Used  Substance Use Topics   Alcohol use: No    Alcohol/week: 0.0 standard drinks   Drug use: No     Allergies   Cymbalta [duloxetine hcl], Tramadol, Sulfa antibiotics, and Doxycycline   Review of Systems Review of Systems  Constitutional: Negative for chills and fever.  Respiratory: Negative for shortness of breath.   Cardiovascular: Positive for leg swelling. Negative for chest pain.  Musculoskeletal: Positive for myalgias. Negative for arthralgias.  Skin: Negative for rash and wound.  Neurological: Negative for weakness and numbness.  All other systems reviewed and are negative.    Physical Exam Updated Vital Signs BP (!) 125/92    Pulse 85    Temp 98.3 F (36.8 C) (Oral)    Resp 20    Ht 5\' 4"  (1.626 m)    Wt 76.1 kg    LMP  07/28/2018    SpO2 99%    BMI 28.79 kg/m   Physical Exam Vitals signs and nursing note reviewed.  Constitutional:      General: She is not in acute distress.    Appearance: Normal appearance. She is well-developed. She is not ill-appearing.  HENT:     Head: Normocephalic and atraumatic.  Eyes:     Pupils: Pupils are equal, round, and reactive to light.  Neck:     Musculoskeletal: Normal range of motion and neck supple.  Cardiovascular:     Rate and Rhythm: Normal rate and regular rhythm.  Pulmonary:     Effort: Pulmonary effort is normal.     Breath sounds: Normal breath sounds.  Abdominal:     Palpations: Abdomen is soft.  Musculoskeletal: Normal range of  motion.        General: Tenderness present.     Comments: Mild posterior left calf tenderness.  Patient also has mild swelling of the left calf compared to the right.  Full range of motion of the left knee and ankle without obvious effusion, erythema or warmth.  Distal pulses are 2+.  Skin:    General: Skin is warm and dry.     Findings: No erythema or rash.  Neurological:     General: No focal deficit present.     Mental Status: She is alert and oriented to person, place, and time.  Psychiatric:        Behavior: Behavior normal.      ED Treatments / Results  Labs (all labs ordered are listed, but only abnormal results are displayed) Labs Reviewed - No data to display  EKG None  Radiology US Venous Img Lower Unilateral Left  Result Date: 08/11/2018 CLINICAL DATA:  Left calf pain throbbing.  Evaluate for DVT. EXAM: LEFT LOWER EXTREMITY VENOUS DOPPLER ULTRASOUND TECHNIQUE: Gray-scale sonography with graded compression, as well as color Doppler and duplex ultrasound were performed to evaluate the lower extremity deep venous systems from the level of the common femoral vein and including the common femoral, femoral, profunda femoral, popliteal and calf veins including the posterior tibial, peroneal and gastrocnemius veins when visible. The superficial great saphenous vein was also interrogated. Spectral Doppler was utilized to evaluate flow at rest and with distal augmentation maneuvers in the common femoral, femoral and popliteal veins. COMPARISON:  None. FINDINGS: Contralateral Common Femoral Vein: Respiratory phasicity is normal and symmetric with the symptomatic side. No evidence of thrombus. Normal compressibility. Common Femoral Vein: No evidence of thrombus. Normal compressibility, respiratory phasicity and response to augmentation. Saphenofemoral Junction: No evidence of thrombus. Normal compressibility and flow on color Doppler imaging. Profunda Femoral Vein: No evidence of thrombus.  Normal compressibility and flow on color Doppler imaging. Femoral Vein: No evidence of thrombus. Normal compressibility, respiratory phasicity and response to augmentation. Popliteal Vein: No evidence of thrombus. Normal compressibility, respiratory phasicity and response to augmentation. Calf Veins: No evidence of thrombus. Normal compressibility and flow on color Doppler imaging. Superficial Great Saphenous Vein: No evidence of thrombus. Normal compressibility. Venous Reflux:  None. Other Findings:  None. IMPRESSION: No evidence of DVT within the left lower extremity. Electronically Signed   By: Sandi Mariscal M.D.   On: 08/11/2018 16:01    Procedures Procedures (including critical care time)  Medications Ordered in ED Medications  ibuprofen (ADVIL) tablet 600 mg (600 mg Oral Given 08/11/18 1612)     Initial Impression / Assessment and Plan / ED Course  I have reviewed the triage vital  signs and the nursing notes.  Pertinent labs & imaging results that were available during my care of the patient were reviewed by me and considered in my medical decision making (see chart for details).        We will get ultrasound to rule out DVT. No evidence of DVT on ultrasound.  Suspect musculoskeletal pain.  Will treat symptomatically.  Return precautions given. Final Clinical Impressions(s) / ED Diagnoses   Final diagnoses:  Musculoskeletal leg pain, left    ED Discharge Orders    None       Julianne Rice, MD 08/11/18 2231

## 2018-08-11 NOTE — Discharge Instructions (Addendum)
You may take Tylenol or ibuprofen as needed for pain.

## 2018-11-14 ENCOUNTER — Other Ambulatory Visit: Payer: Self-pay

## 2018-11-14 DIAGNOSIS — Z20822 Contact with and (suspected) exposure to covid-19: Secondary | ICD-10-CM

## 2018-11-14 DIAGNOSIS — Z20828 Contact with and (suspected) exposure to other viral communicable diseases: Secondary | ICD-10-CM | POA: Diagnosis not present

## 2018-11-15 LAB — NOVEL CORONAVIRUS, NAA: SARS-CoV-2, NAA: NOT DETECTED

## 2019-01-10 DIAGNOSIS — Z01419 Encounter for gynecological examination (general) (routine) without abnormal findings: Secondary | ICD-10-CM | POA: Diagnosis not present

## 2019-01-10 DIAGNOSIS — Z3041 Encounter for surveillance of contraceptive pills: Secondary | ICD-10-CM | POA: Diagnosis not present

## 2019-01-10 DIAGNOSIS — Z6830 Body mass index (BMI) 30.0-30.9, adult: Secondary | ICD-10-CM | POA: Diagnosis not present

## 2019-01-10 DIAGNOSIS — Z124 Encounter for screening for malignant neoplasm of cervix: Secondary | ICD-10-CM | POA: Diagnosis not present

## 2019-01-10 DIAGNOSIS — Z1231 Encounter for screening mammogram for malignant neoplasm of breast: Secondary | ICD-10-CM | POA: Diagnosis not present

## 2019-01-16 DIAGNOSIS — I341 Nonrheumatic mitral (valve) prolapse: Secondary | ICD-10-CM | POA: Diagnosis not present

## 2019-01-16 DIAGNOSIS — Z87442 Personal history of urinary calculi: Secondary | ICD-10-CM | POA: Diagnosis not present

## 2019-01-16 DIAGNOSIS — E039 Hypothyroidism, unspecified: Secondary | ICD-10-CM | POA: Diagnosis not present

## 2019-01-16 DIAGNOSIS — E559 Vitamin D deficiency, unspecified: Secondary | ICD-10-CM | POA: Diagnosis not present

## 2019-01-16 DIAGNOSIS — D509 Iron deficiency anemia, unspecified: Secondary | ICD-10-CM | POA: Diagnosis not present

## 2019-01-16 DIAGNOSIS — Z13220 Encounter for screening for lipoid disorders: Secondary | ICD-10-CM | POA: Diagnosis not present

## 2019-01-16 DIAGNOSIS — Z0000 Encounter for general adult medical examination without abnormal findings: Secondary | ICD-10-CM | POA: Diagnosis not present

## 2019-01-16 DIAGNOSIS — E785 Hyperlipidemia, unspecified: Secondary | ICD-10-CM | POA: Diagnosis not present

## 2019-01-16 DIAGNOSIS — R21 Rash and other nonspecific skin eruption: Secondary | ICD-10-CM | POA: Diagnosis not present

## 2019-03-02 DIAGNOSIS — D485 Neoplasm of uncertain behavior of skin: Secondary | ICD-10-CM | POA: Diagnosis not present

## 2019-03-28 DIAGNOSIS — L03032 Cellulitis of left toe: Secondary | ICD-10-CM | POA: Diagnosis not present

## 2019-04-13 DIAGNOSIS — L7211 Pilar cyst: Secondary | ICD-10-CM | POA: Diagnosis not present

## 2019-04-17 DIAGNOSIS — E785 Hyperlipidemia, unspecified: Secondary | ICD-10-CM | POA: Diagnosis not present

## 2019-04-17 DIAGNOSIS — L03032 Cellulitis of left toe: Secondary | ICD-10-CM | POA: Diagnosis not present

## 2019-04-17 DIAGNOSIS — E559 Vitamin D deficiency, unspecified: Secondary | ICD-10-CM | POA: Diagnosis not present

## 2019-04-17 DIAGNOSIS — E039 Hypothyroidism, unspecified: Secondary | ICD-10-CM | POA: Diagnosis not present

## 2019-04-17 DIAGNOSIS — M205X2 Other deformities of toe(s) (acquired), left foot: Secondary | ICD-10-CM | POA: Diagnosis not present

## 2019-04-20 ENCOUNTER — Ambulatory Visit (INDEPENDENT_AMBULATORY_CARE_PROVIDER_SITE_OTHER): Payer: 59 | Admitting: *Deleted

## 2019-04-20 ENCOUNTER — Other Ambulatory Visit: Payer: Self-pay

## 2019-04-20 DIAGNOSIS — L7211 Pilar cyst: Secondary | ICD-10-CM

## 2019-05-01 DIAGNOSIS — M205X2 Other deformities of toe(s) (acquired), left foot: Secondary | ICD-10-CM | POA: Diagnosis not present

## 2019-05-01 DIAGNOSIS — B351 Tinea unguium: Secondary | ICD-10-CM | POA: Diagnosis not present

## 2019-05-01 DIAGNOSIS — L602 Onychogryphosis: Secondary | ICD-10-CM | POA: Diagnosis not present

## 2019-05-01 DIAGNOSIS — L03032 Cellulitis of left toe: Secondary | ICD-10-CM | POA: Diagnosis not present

## 2019-05-01 DIAGNOSIS — M898X7 Other specified disorders of bone, ankle and foot: Secondary | ICD-10-CM | POA: Diagnosis not present

## 2019-05-17 DIAGNOSIS — L602 Onychogryphosis: Secondary | ICD-10-CM | POA: Diagnosis not present

## 2019-05-17 DIAGNOSIS — M205X2 Other deformities of toe(s) (acquired), left foot: Secondary | ICD-10-CM | POA: Diagnosis not present

## 2019-05-17 DIAGNOSIS — L03032 Cellulitis of left toe: Secondary | ICD-10-CM | POA: Diagnosis not present

## 2019-05-17 DIAGNOSIS — M898X7 Other specified disorders of bone, ankle and foot: Secondary | ICD-10-CM | POA: Diagnosis not present

## 2019-06-01 ENCOUNTER — Other Ambulatory Visit: Payer: Self-pay

## 2019-06-01 ENCOUNTER — Encounter: Payer: Self-pay | Admitting: Physician Assistant

## 2019-06-01 ENCOUNTER — Encounter: Payer: 59 | Admitting: Physician Assistant

## 2019-06-01 ENCOUNTER — Ambulatory Visit (INDEPENDENT_AMBULATORY_CARE_PROVIDER_SITE_OTHER): Payer: 59 | Admitting: Physician Assistant

## 2019-06-01 DIAGNOSIS — L7211 Pilar cyst: Secondary | ICD-10-CM

## 2019-06-01 DIAGNOSIS — L905 Scar conditions and fibrosis of skin: Secondary | ICD-10-CM | POA: Diagnosis not present

## 2019-06-01 NOTE — Patient Instructions (Signed)

## 2019-06-01 NOTE — Progress Notes (Deleted)
   Follow-Up Visit   Subjective  Theresa Schwartz is a 49 y.o. female who presents for the following: Procedure (Here to have 2 pilar cysts removed from scalp. Did notice that she has another one that is new.).   Location: scalp x3  Duration: years Quality: nodules Associated Signs/Symptoms: irritated Modifying Factors: persistent Severity: stable    The following portions of the chart were reviewed this encounter and updated as appropriate: Tobacco  Allergies  Meds  Problems  Med Hx  Surg Hx  Fam Hx      Objective  Well appearing patient in no apparent distress; mood and affect are within normal limits.  A focused examination was performed including scalp. Relevant physical exam findings are noted in the Assessment and Plan.  Objective  Mid Parietal Scalp, R Frontal Scalp, left crown lateral: nodules  Assessment & Plan  Pilar cyst (3) R Frontal Scalp; Mid Parietal Scalp; left crown lateral  Skin excision - Mid Parietal Scalp, R Frontal Scalp, left crown lateral  Lesion length (cm):  0.5 Lesion width (cm):  0.5 Margin per side (cm):  0 Total excision diameter (cm):  0.5 Informed consent: discussed and consent obtained   Timeout: patient name, date of birth, surgical site, and procedure verified   Anesthesia: the lesion was anesthetized in a standard fashion   Anesthetic:  1% lidocaine w/ epinephrine 1-100,000 local infiltration Instrument used: 5 mm punch   Hemostasis achieved with: pressure and electrodesiccation   Outcome: patient tolerated procedure well with no complications    wound care instructions given   Dressing type: none   Specimen 1 - Surgical pathology Differential Diagnosis: Pilar Cyst Check Margins: No nodules  Specimen 2 - Surgical pathology Differential Diagnosis: Pilar Cyst Check Margins: no nodules  Specimen 3 - Surgical pathology Differential Diagnosis:  Check Margins: No nodules     I have reviewed the above documentation for  accuracy and completeness and I agree with the above.  Robyne Askew PA-C

## 2019-06-06 ENCOUNTER — Ambulatory Visit: Payer: 59 | Admitting: Podiatry

## 2019-06-06 ENCOUNTER — Encounter: Payer: Self-pay | Admitting: Podiatry

## 2019-06-06 ENCOUNTER — Other Ambulatory Visit: Payer: Self-pay

## 2019-06-06 ENCOUNTER — Ambulatory Visit (INDEPENDENT_AMBULATORY_CARE_PROVIDER_SITE_OTHER): Payer: 59

## 2019-06-06 VITALS — Temp 97.7°F

## 2019-06-06 DIAGNOSIS — G8929 Other chronic pain: Secondary | ICD-10-CM | POA: Diagnosis not present

## 2019-06-06 DIAGNOSIS — M779 Enthesopathy, unspecified: Secondary | ICD-10-CM

## 2019-06-06 DIAGNOSIS — M778 Other enthesopathies, not elsewhere classified: Secondary | ICD-10-CM

## 2019-06-06 DIAGNOSIS — M79675 Pain in left toe(s): Secondary | ICD-10-CM | POA: Diagnosis not present

## 2019-06-07 ENCOUNTER — Ambulatory Visit (INDEPENDENT_AMBULATORY_CARE_PROVIDER_SITE_OTHER): Payer: 59 | Admitting: *Deleted

## 2019-06-07 DIAGNOSIS — Z4802 Encounter for removal of sutures: Secondary | ICD-10-CM

## 2019-06-07 NOTE — Progress Notes (Signed)
Here for nurse to see suture removal. Removed x 6 sutures in 3 spots on the scalp. Looks good no signs of infection. Pathology repot to patient.

## 2019-06-08 ENCOUNTER — Ambulatory Visit: Payer: 59

## 2019-06-09 NOTE — Progress Notes (Signed)
Subjective:   Patient ID: Theresa Schwartz, female   DOB: 49 y.o.   MRN: [id number]   HPI 49 year old female presents the office today for concerns of discomfort to her left big toe.  This started in December 2020.  She states that she had gel nails put on and when they came off she noticed that the nail was loose.  Originally it was an ingrown toenail and part of the nail was removed and and the nail was then removed in total at a later visit.  She continued pain x-rays were obtained which was concerning for a bone spur.  She gets discomfort of the tip of the toe as well as to the bottom of the toe particularly bending the toe.  She states that overall it has been getting somewhat better.  She denies any recent injury or trauma.   Review of Systems  All other systems reviewed and are negative.  Past Medical History:  Diagnosis Date  . Hypothyroidism 12/26/2012   replacement therapy  . Interstitial cystitis 12/26/2012   Followed by Dr. Vikki Ports  . Mitral valve prolapse syndrome 12/26/2012  . Other physical therapy    for loss of curvature in her cervical spine  . Palpitations 12/26/2012   Previously documented to be related to PAT (very short salvos) and PACs     Past Surgical History:  Procedure Laterality Date  . CESAREAN SECTION    . CYSTOSCOPY       Current Outpatient Medications:  .  CALCIUM PO, Take 600 mg by mouth daily., Disp: , Rfl:  .  Cholecalciferol (VITAMIN D3) 1000 UNITS CAPS, Take 1,000 Units by mouth daily., Disp: , Rfl:  .  ergocalciferol (VITAMIN D2) 1.25 MG (50000 UT) capsule, ergocalciferol (vitamin D2) 1,250 mcg (50,000 unit) capsule, Disp: , Rfl:  .  KELNOR 1/50 1-50 MG-MCG tablet, Take 1 tablet by mouth daily., Disp: , Rfl:  .  levothyroxine (SYNTHROID, LEVOTHROID) 25 MCG tablet, Take 25 mcg by mouth daily., Disp: , Rfl:  .  metoprolol succinate (TOPROL-XL) 25 MG 24 hr tablet, metoprolol succinate ER 25 mg tablet,extended release 24 hr  TAKE 1 TABLET BY MOUTH  AS NEEDED FOR PALPITATIONS, Disp: , Rfl:  .  metoprolol tartrate (LOPRESSOR) 25 MG tablet, Take 1 tablet (25 mg total) by mouth as needed (palpitations)., Disp: 10 tablet, Rfl: 3 .  nitrofurantoin, macrocrystal-monohydrate, (MACROBID) 100 MG capsule, nitrofurantoin monohydrate/macrocrystals 100 mg capsule  TAKE ONE CAPSULE BY MOUTH TWICE A DAY, Disp: , Rfl:  .  Norethindrone-Eth Estradiol (ALYACEN 1/35 PO), Take 1 tablet by mouth daily., Disp: , Rfl:  .  simvastatin (ZOCOR) 10 MG tablet, Take 10 mg by mouth daily., Disp: , Rfl:  .  SUMAtriptan (IMITREX) 50 MG tablet, sumatriptan 50 mg tablet  TAKE 1 TABLET BY MOUTH AS NEEDED FOR HEADACHE CAN REPEAT ONCE IN 2 3 HOURS IF NEEDED., Disp: , Rfl:  .  tiZANidine (ZANAFLEX) 4 MG tablet, tizanidine 4 mg tablet  1 TABLET AS NEEDED THREE TIMES A DAY ORALLY 10, Disp: , Rfl:  .  triamcinolone cream (KENALOG) 0.1 %, APPLY TWICE A DAY X1 WEEK, Disp: , Rfl:  .  triamterene-hydrochlorothiazide (DYAZIDE) 37.5-25 MG per capsule, Take 1 each (1 capsule total) by mouth daily., Disp: , Rfl:   Allergies  Allergen Reactions  . Cymbalta [Duloxetine Hcl] Other (See Comments)    REACTION: "made me depressed"  . Tramadol Itching  . Sulfa Antibiotics Other (See Comments)    REACTION: "told by mother  had allergy"  . Doxycycline Other (See Comments)    REACTION: :swollen hot throat; itching"          Objective:  Physical Exam  General: AAO x3, NAD  Dermatological: On the left hallux toenail the nail bed site is healed.  Minimal erythema but it is more from where skin rub inside shoes as there is no toenail present.  There is no drainage or pus.  No warmth.  No ascending cellulitis.  No obvious signs of infection are noted.  Vascular: Dorsalis Pedis artery and Posterior Tibial artery pedal pulses are 2/4 bilateral with immedate capillary fill time. There is no pain with calf compression, swelling, warmth, erythema.   Neruologic: Grossly intact via light touch  bilateral.   Musculoskeletal: Mild discomfort of the distal aspect of the left hallux toenail.  Appears to be more towards the entire distal aspect and not just localized to the dorsal distal aspect.  Muscular strength 5/5 in all groups tested bilateral.  Gait: Unassisted, Nonantalgic.       Assessment:   Small bone spur left hallux    Plan:  -Treatment options discussed including all alternatives, risks, and complications -Etiology of symptoms were discussed -X-rays were obtained and reviewed with the patient.  Minimal spurring of the distal aspect of the distal phalanx dorsally.  No evidence of acute fracture or osteolysis. -I am not convinced of the bone spurs causing her discomfort.  Think part of the discomfort now that she is not having nail can reconcile shoes.  Dispensed offloading pads.  If in the future she continues have symptoms consider removal of the spur if needed but I recommend holding off on this for now and allow the toe to heal as she has had quite a bit of trauma to the nail recently with nail removals.  Return if symptoms worsen or fail to improve.  Trula Slade DPM

## 2019-09-13 DIAGNOSIS — M7711 Lateral epicondylitis, right elbow: Secondary | ICD-10-CM | POA: Diagnosis not present

## 2019-09-13 DIAGNOSIS — M7712 Lateral epicondylitis, left elbow: Secondary | ICD-10-CM | POA: Diagnosis not present

## 2019-09-20 NOTE — Progress Notes (Deleted)
   Follow-Up Visit   Subjective  Theresa Schwartz is a 49 y.o. female who presents for the following: Procedure (Here to have 2 pilar cysts removed from scalp. Did notice that she has another one that is new.).   The following portions of the chart were reviewed this encounter and updated as appropriate: Tobacco  Allergies  Meds  Problems  Med Hx  Surg Hx  Fam Hx      Objective  Well appearing patient in no apparent distress; mood and affect are within normal limits.  A focused examination was performed including scalp. Relevant physical exam findings are noted in the Assessment and Plan.  Objective  Mid Parietal Scalp, R Frontal Scalp, left crown lateral: nodules   Assessment & Plan  Pilar cyst (3) R Frontal Scalp; Mid Parietal Scalp; left crown lateral  Skin excision - Mid Parietal Scalp, R Frontal Scalp, left crown lateral  Lesion length (cm):  0.5 Lesion width (cm):  0.5 Margin per side (cm):  0 Total excision diameter (cm):  0.5 Informed consent: discussed and consent obtained   Timeout: patient name, date of birth, surgical site, and procedure verified   Anesthesia: the lesion was anesthetized in a standard fashion   Anesthetic:  1% lidocaine w/ epinephrine 1-100,000 local infiltration Instrument used: #15 blade   Hemostasis achieved with: pressure and electrodesiccation   Outcome: patient tolerated procedure well with no complications   Post-procedure details: sterile dressing applied and wound care instructions given   Dressing type: bandage, petrolatum and pressure dressing    Skin repair - Mid Parietal Scalp, R Frontal Scalp, left crown lateral Complexity:  Simple Final length (cm):  1 Informed consent: discussed and consent obtained   Timeout: patient name, date of birth, surgical site, and procedure verified   Procedure prep:  Patient was prepped and draped in usual sterile fashion (non sterile) Prep type:  Chlorhexidine Anesthesia: the lesion was  anesthetized in a standard fashion   Undermining: edges undermined   Fine/surface layer approximation (top stitches):  Suture size:  4-0 Suture type: Vicryl Rapide (coated polyglactin 910)   Suture type comment:  Nylon Stitches: simple running   Suture removal (days):  7 Hemostasis achieved with: suture, pressure and electrodesiccation Outcome: patient tolerated procedure well with no complications   Post-procedure details: wound care instructions given   Post-procedure details comment:  Non sterile pressure   Specimen 1 - Surgical pathology Differential Diagnosis: Pilar Cyst Check Margins: No nodules  Specimen 2 - Surgical pathology Differential Diagnosis: Pilar Cyst Check Margins: no nodules  Specimen 3 - Surgical pathology Differential Diagnosis:  Check Margins: No nodules    I, Shauntae Reitman, PA-C, have reviewed all documentation's for this visit.  The documentation on 09/20/19 for the exam, diagnosis, procedures and orders are all accurate and complete.

## 2019-10-02 NOTE — Progress Notes (Signed)
Follow-Up Visit   Subjective  Theresa Schwartz is a 49 y.o. female who presents for the following: Procedure (Here to have 2 pilar cysts removed from scalp. Did notice that she has another one that is new.).   The following portions of the chart were reviewed this encounter and updated as appropriate: Tobacco  Allergies  Meds  Problems  Med Hx  Surg Hx  Fam Hx      Objective  Well appearing patient in no apparent distress; mood and affect are within normal limits.  A focused examination was performed including scalp. Relevant physical exam findings are noted in the Assessment and Plan.  Objective  Mid Parietal Scalp, R Frontal Scalp, left crown lateral: nodules  Assessment & Plan  Pilar cyst (3) R Frontal Scalp; Mid Parietal Scalp; left crown lateral  Skin excision - R Frontal Scalp  Lesion length (cm):  0.5 Lesion width (cm):  0.5 Margin per side (cm):  0 Total excision diameter (cm):  0.5 Informed consent: discussed and consent obtained   Timeout: patient name, date of birth, surgical site, and procedure verified   Anesthesia: the lesion was anesthetized in a standard fashion   Anesthetic:  1% lidocaine w/ epinephrine 1-100,000 local infiltration Instrument used: #15 blade   Hemostasis achieved with: pressure and electrodesiccation   Outcome: patient tolerated procedure well with no complications   Post-procedure details: sterile dressing applied and wound care instructions given   Dressing type: bandage, petrolatum and pressure dressing    Skin repair - R Frontal Scalp Complexity:  Simple Final length (cm):  1 Informed consent: discussed and consent obtained   Timeout: patient name, date of birth, surgical site, and procedure verified   Procedure prep:  Patient was prepped and draped in usual sterile fashion (non sterile) Prep type:  Chlorhexidine Anesthesia: the lesion was anesthetized in a standard fashion   Undermining: edges undermined   Fine/surface  layer approximation (top stitches):  Suture size:  4-0 Suture type: Vicryl Rapide (coated polyglactin 910)   Suture type comment:  Nylon Stitches: simple running   Suture removal (days):  7 Hemostasis achieved with: suture, pressure and electrodesiccation Outcome: patient tolerated procedure well with no complications   Post-procedure details: wound care instructions given   Post-procedure details comment:  Non sterile pressure   Skin excision - left crown lateral  Lesion length (cm):  1 Lesion width (cm):  1 Margin per side (cm):  0.2 Total excision diameter (cm):  1.4 Informed consent: discussed and consent obtained   Timeout: patient name, date of birth, surgical site, and procedure verified   Anesthesia: the lesion was anesthetized in a standard fashion   Anesthetic:  1% lidocaine w/ epinephrine 1-100,000 local infiltration Instrument used: #15 blade   Hemostasis achieved with: pressure and electrodesiccation   Outcome: patient tolerated procedure well with no complications   Post-procedure details: sterile dressing applied and wound care instructions given   Dressing type: bandage, petrolatum and pressure dressing    Skin repair - left crown lateral Complexity:  Simple Final length (cm):  1.4 Informed consent: discussed and consent obtained   Timeout: patient name, date of birth, surgical site, and procedure verified   Procedure prep:  Patient was prepped and draped in usual sterile fashion (non sterile) Prep type:  Chlorhexidine Anesthesia: the lesion was anesthetized in a standard fashion   Undermining: edges undermined   Fine/surface layer approximation (top stitches):  Suture size:  4-0 Suture type: Vicryl Rapide (coated polyglactin 910) and  nylon   Suture type comment:  Nylon Stitches: simple running   Suture removal (days):  7 Hemostasis achieved with: suture, pressure and electrodesiccation Outcome: patient tolerated procedure well with no complications     Post-procedure details: wound care instructions given   Post-procedure details comment:  Non sterile pressure   Skin excision - Mid Parietal Scalp  Total excision diameter (cm):  0.5 Informed consent: discussed and consent obtained   Timeout: patient name, date of birth, surgical site, and procedure verified   Anesthesia: the lesion was anesthetized in a standard fashion   Anesthetic:  1% lidocaine w/ epinephrine 1-100,000 local infiltration Instrument used: #15 blade   Hemostasis achieved with: pressure and electrodesiccation   Outcome: patient tolerated procedure well with no complications   Post-procedure details: sterile dressing applied and wound care instructions given   Dressing type: bandage, petrolatum and pressure dressing    Skin repair - Mid Parietal Scalp Complexity:  Simple Final length (cm):  0.5 Informed consent: discussed and consent obtained   Timeout: patient name, date of birth, surgical site, and procedure verified   Procedure prep:  Patient was prepped and draped in usual sterile fashion (non sterile) Prep type:  Chlorhexidine Anesthesia: the lesion was anesthetized in a standard fashion   Undermining: edges undermined   Fine/surface layer approximation (top stitches):  Suture size:  4-0 Suture type: Vicryl Rapide (coated polyglactin 910) and nylon   Suture type comment:  Nylon Stitches: simple running   Suture removal (days):  7 Hemostasis achieved with: suture, pressure and electrodesiccation Outcome: patient tolerated procedure well with no complications   Post-procedure details: wound care instructions given   Post-procedure details comment:  Non sterile pressure   Specimen 1 - Surgical pathology Differential Diagnosis: Pilar Cyst Check Margins: No nodules  Specimen 2 - Surgical pathology Differential Diagnosis: Pilar Cyst Check Margins: no nodules  Specimen 3 - Surgical pathology Differential Diagnosis:  Check Margins: No nodules   I,  Allisson Schindel, PA-C, have reviewed all documentation's for this visit.  The documentation on 10/02/19 for the exam, diagnosis, procedures and orders are all accurate and complete.

## 2019-10-02 NOTE — Addendum Note (Signed)
Addended by: Robyne Askew R on: 10/02/2019 05:30 PM   Modules accepted: Orders

## 2019-10-11 DIAGNOSIS — M7711 Lateral epicondylitis, right elbow: Secondary | ICD-10-CM | POA: Diagnosis not present

## 2019-10-11 DIAGNOSIS — M7712 Lateral epicondylitis, left elbow: Secondary | ICD-10-CM | POA: Diagnosis not present

## 2020-01-17 DIAGNOSIS — E559 Vitamin D deficiency, unspecified: Secondary | ICD-10-CM | POA: Diagnosis not present

## 2020-01-17 DIAGNOSIS — Z0000 Encounter for general adult medical examination without abnormal findings: Secondary | ICD-10-CM | POA: Diagnosis not present

## 2020-01-17 DIAGNOSIS — E039 Hypothyroidism, unspecified: Secondary | ICD-10-CM | POA: Diagnosis not present

## 2020-01-17 DIAGNOSIS — D509 Iron deficiency anemia, unspecified: Secondary | ICD-10-CM | POA: Diagnosis not present

## 2020-01-17 DIAGNOSIS — Z1231 Encounter for screening mammogram for malignant neoplasm of breast: Secondary | ICD-10-CM | POA: Diagnosis not present

## 2020-01-17 DIAGNOSIS — Z87442 Personal history of urinary calculi: Secondary | ICD-10-CM | POA: Diagnosis not present

## 2020-01-17 DIAGNOSIS — I341 Nonrheumatic mitral (valve) prolapse: Secondary | ICD-10-CM | POA: Diagnosis not present

## 2020-01-17 DIAGNOSIS — E785 Hyperlipidemia, unspecified: Secondary | ICD-10-CM | POA: Diagnosis not present

## 2020-01-22 DIAGNOSIS — Z01419 Encounter for gynecological examination (general) (routine) without abnormal findings: Secondary | ICD-10-CM | POA: Diagnosis not present

## 2020-01-22 DIAGNOSIS — Z1231 Encounter for screening mammogram for malignant neoplasm of breast: Secondary | ICD-10-CM | POA: Diagnosis not present

## 2020-03-28 ENCOUNTER — Ambulatory Visit: Payer: 59 | Admitting: Physician Assistant

## 2020-06-20 ENCOUNTER — Other Ambulatory Visit: Payer: Self-pay

## 2020-06-20 ENCOUNTER — Ambulatory Visit (INDEPENDENT_AMBULATORY_CARE_PROVIDER_SITE_OTHER): Payer: 59 | Admitting: Physician Assistant

## 2020-06-20 ENCOUNTER — Encounter: Payer: Self-pay | Admitting: Physician Assistant

## 2020-06-20 DIAGNOSIS — L7211 Pilar cyst: Secondary | ICD-10-CM

## 2020-06-20 NOTE — Progress Notes (Signed)
   Follow-Up Visit   Subjective  Theresa Schwartz is a 50 y.o. female who presents for the following: Cyst (Patient has a pilar cyst on scalp wants removed. ).   The following portions of the chart were reviewed this encounter and updated as appropriate:      Objective  Well appearing patient in no apparent distress; mood and affect are within normal limits.  A focused examination was performed including scalp. Relevant physical exam findings are noted in the Assessment and Plan.  Objective  Scalp: Subdermal nodule   Assessment & Plan  Pilar cyst Scalp  Skin / nail biopsy - Scalp Type of biopsy: punch   Informed consent: discussed and consent obtained   Procedure prep:  Patient was prepped and draped in usual sterile fashion (nonsterile) Prep type:  Chlorhexidine Anesthesia: the lesion was anesthetized in a standard fashion   Anesthetic:  1% lidocaine w/ epinephrine 1-100,000 local infiltration Punch size:  5 mm Suture size:  4-0 Suture type: Prolene (polypropylene)   Suture removal (days):  7 Outcome: patient tolerated procedure well   Post-procedure details: wound care instructions given   Post-procedure details comment:  Nonsterile  Specimen 1 - Surgical pathology Differential Diagnosis: pilar cyst  Check Margins: No    I, Galaxy Borden, PA-C, have reviewed all documentation's for this visit.  The documentation on 06/20/20 for the exam, diagnosis, procedures and orders are all accurate and complete.

## 2020-06-20 NOTE — Patient Instructions (Signed)

## 2020-06-26 ENCOUNTER — Ambulatory Visit (INDEPENDENT_AMBULATORY_CARE_PROVIDER_SITE_OTHER): Payer: 59 | Admitting: *Deleted

## 2020-06-26 ENCOUNTER — Other Ambulatory Visit: Payer: Self-pay

## 2020-06-26 DIAGNOSIS — Z4802 Encounter for removal of sutures: Secondary | ICD-10-CM

## 2020-06-26 NOTE — Progress Notes (Signed)
Here for suture removal- no sign or symptom of infection- pathology still pending- told patient to call in a week for pathology results.

## 2020-10-30 ENCOUNTER — Ambulatory Visit (INDEPENDENT_AMBULATORY_CARE_PROVIDER_SITE_OTHER): Payer: 59 | Admitting: Physician Assistant

## 2020-10-30 ENCOUNTER — Other Ambulatory Visit: Payer: Self-pay

## 2020-10-30 ENCOUNTER — Encounter: Payer: Self-pay | Admitting: Physician Assistant

## 2020-10-30 DIAGNOSIS — L7211 Pilar cyst: Secondary | ICD-10-CM

## 2020-10-30 DIAGNOSIS — D485 Neoplasm of uncertain behavior of skin: Secondary | ICD-10-CM

## 2020-10-30 NOTE — Progress Notes (Signed)
   Follow-Up Visit   Subjective  Theresa Schwartz is a 50 y.o. female who presents for the following: Cyst (New nodule in scalp). Growing.   The following portions of the chart were reviewed this encounter and updated as appropriate:  Tobacco  Allergies  Meds  Problems  Med Hx  Surg Hx  Fam Hx      Objective  Well appearing patient in no apparent distress; mood and affect are within normal limits.  A focused examination was performed including scalp. Relevant physical exam findings are noted in the Assessment and Plan.  Mid Occipital Scalp nodule   Assessment & Plan  Neoplasm of uncertain behavior of skin Mid Occipital Scalp  Skin / nail biopsy Type of biopsy: punch   Informed consent: discussed and consent obtained   Timeout: patient name, date of birth, surgical site, and procedure verified   Procedure prep:  Patient was prepped and draped in usual sterile fashion (Non sterile) Prep type:  Chlorhexidine Anesthesia: the lesion was anesthetized in a standard fashion   Anesthetic:  1% lidocaine w/ epinephrine 1-100,000 local infiltration Punch size:  8 mm Suture size:  4-0 Suture type: Prolene (polypropylene)   Suture removal (days):  12 Hemostasis achieved with: suture   Outcome: patient tolerated procedure well   Additional details:  4-0 Prolene x 2  Specimen 1 - Surgical pathology Differential Diagnosis: r/o cyst  Check Margins: No    I, Shavonna Corella, PA-C, have reviewed all documentation's for this visit.  The documentation on 10/30/20 for the exam, diagnosis, procedures and orders are all accurate and complete.

## 2020-10-30 NOTE — Patient Instructions (Signed)

## 2020-11-07 ENCOUNTER — Ambulatory Visit (INDEPENDENT_AMBULATORY_CARE_PROVIDER_SITE_OTHER): Payer: 59 | Admitting: *Deleted

## 2020-11-07 ENCOUNTER — Other Ambulatory Visit: Payer: Self-pay

## 2020-11-07 DIAGNOSIS — Z4802 Encounter for removal of sutures: Secondary | ICD-10-CM

## 2020-11-07 NOTE — Progress Notes (Signed)
Here for NTS suture removal. No signs or symptoms of infection. Path to patient. 

## 2021-02-04 DIAGNOSIS — N926 Irregular menstruation, unspecified: Secondary | ICD-10-CM | POA: Diagnosis not present

## 2021-02-04 DIAGNOSIS — Z124 Encounter for screening for malignant neoplasm of cervix: Secondary | ICD-10-CM | POA: Diagnosis not present

## 2021-02-05 DIAGNOSIS — E559 Vitamin D deficiency, unspecified: Secondary | ICD-10-CM | POA: Diagnosis not present

## 2021-02-05 DIAGNOSIS — M19049 Primary osteoarthritis, unspecified hand: Secondary | ICD-10-CM | POA: Diagnosis not present

## 2021-02-05 DIAGNOSIS — E785 Hyperlipidemia, unspecified: Secondary | ICD-10-CM | POA: Diagnosis not present

## 2021-02-05 DIAGNOSIS — Z0000 Encounter for general adult medical examination without abnormal findings: Secondary | ICD-10-CM | POA: Diagnosis not present

## 2021-02-05 DIAGNOSIS — R700 Elevated erythrocyte sedimentation rate: Secondary | ICD-10-CM | POA: Diagnosis not present

## 2021-02-05 DIAGNOSIS — D649 Anemia, unspecified: Secondary | ICD-10-CM | POA: Diagnosis not present

## 2021-02-12 ENCOUNTER — Ambulatory Visit (INDEPENDENT_AMBULATORY_CARE_PROVIDER_SITE_OTHER): Payer: Managed Care, Other (non HMO)

## 2021-02-12 ENCOUNTER — Encounter: Payer: Self-pay | Admitting: Podiatry

## 2021-02-12 ENCOUNTER — Ambulatory Visit (INDEPENDENT_AMBULATORY_CARE_PROVIDER_SITE_OTHER): Payer: Managed Care, Other (non HMO) | Admitting: Podiatry

## 2021-02-12 ENCOUNTER — Other Ambulatory Visit: Payer: Self-pay

## 2021-02-12 DIAGNOSIS — M7672 Peroneal tendinitis, left leg: Secondary | ICD-10-CM

## 2021-02-12 DIAGNOSIS — T1490XA Injury, unspecified, initial encounter: Secondary | ICD-10-CM

## 2021-02-12 DIAGNOSIS — Z23 Encounter for immunization: Secondary | ICD-10-CM | POA: Diagnosis not present

## 2021-02-12 MED ORDER — TRIAMCINOLONE ACETONIDE 10 MG/ML IJ SUSP
10.0000 mg | Freq: Once | INTRAMUSCULAR | Status: AC
  Administered 2021-02-12: 10 mg

## 2021-02-12 MED ORDER — DICLOFENAC SODIUM 75 MG PO TBEC: 75.0000 mg | DELAYED_RELEASE_TABLET | Freq: Two times a day (BID) | ORAL | 2 refills | Status: DC

## 2021-02-12 NOTE — Progress Notes (Signed)
G left

## 2021-02-12 NOTE — Progress Notes (Signed)
Subjective:   Patient ID: Cipriano Mile, female   DOB: 51 y.o.   MRN: [id number]   HPI Patient presents stating that she had an injury 7 months ago felt a pop in the top of her foot and its been hurting her ever since.  Admits that she is not done any treatment for it currently neuro   ROS      Objective:  Physical Exam  Vascular status intact with intense discomfort on the lateral side of the left foot near the insertion of peroneal tendon but slightly more medial indicating peroneal tertius involvement.  It was relatively diffuse and I could not find 1 specific spot     Assessment:  Possibility for tendon ligament injury versus any kind of bony injury with patient not really doing anything to rest this foot     Plan:  H&P reviewed condition and today went ahead did sterile prep and did inject the peroneal tertius complex 3 mg dexamethasone Kenalog 5 mg Xylocaine and then applied air fracture walker to completely immobilize along with ice.  If symptoms do not improve we will need to get an MRI to rule out further pathology or joint pathology  X-rays indicate there can be possibly some pathology around the fourth metatarsal base difficult to make determination and further testing may be done based on response to conservative treatment also placing patient on diclofenac 75 mg twice daily

## 2021-02-17 ENCOUNTER — Other Ambulatory Visit: Payer: Self-pay | Admitting: Podiatry

## 2021-02-17 DIAGNOSIS — M7672 Peroneal tendinitis, left leg: Secondary | ICD-10-CM

## 2021-03-10 ENCOUNTER — Ambulatory Visit: Payer: Managed Care, Other (non HMO) | Admitting: Podiatry

## 2021-03-19 ENCOUNTER — Other Ambulatory Visit: Payer: Self-pay

## 2021-03-19 ENCOUNTER — Encounter: Payer: Self-pay | Admitting: Podiatry

## 2021-03-19 ENCOUNTER — Ambulatory Visit: Payer: 59 | Admitting: Podiatry

## 2021-03-19 DIAGNOSIS — T1490XA Injury, unspecified, initial encounter: Secondary | ICD-10-CM

## 2021-03-19 DIAGNOSIS — M7672 Peroneal tendinitis, left leg: Secondary | ICD-10-CM

## 2021-03-19 NOTE — Progress Notes (Signed)
Subjective:   Patient ID: Theresa Schwartz, female   DOB: 51 y.o.   MRN: [id number]   HPI Patient states she is feeling quite a bit better still has discomfort but a lot improved over where it was   ROS      Objective:  Physical Exam  Neurovascular status intact with inflammation pain that is occurring around the left foot that has improved with only mild discomfort currently noted     Assessment:  Peroneal tendinitis improving left still painful but better     Plan:  Advised on continued boot usage ice therapy reduced activity with gradual increase on a slow basis.  Reappoint to recheck encouraged to call questions concerns

## 2021-03-31 DIAGNOSIS — N921 Excessive and frequent menstruation with irregular cycle: Secondary | ICD-10-CM | POA: Diagnosis not present

## 2021-03-31 DIAGNOSIS — D259 Leiomyoma of uterus, unspecified: Secondary | ICD-10-CM | POA: Diagnosis not present

## 2021-04-09 ENCOUNTER — Other Ambulatory Visit: Payer: Self-pay | Admitting: Obstetrics & Gynecology

## 2021-04-09 DIAGNOSIS — D259 Leiomyoma of uterus, unspecified: Secondary | ICD-10-CM

## 2021-04-14 ENCOUNTER — Ambulatory Visit
Admission: RE | Admit: 2021-04-14 | Discharge: 2021-04-14 | Disposition: A | Discharge status: Home / Self Care | Payer: 59 | Source: Ambulatory Visit | Attending: Obstetrics & Gynecology | Admitting: Obstetrics & Gynecology

## 2021-04-14 DIAGNOSIS — D259 Leiomyoma of uterus, unspecified: Secondary | ICD-10-CM

## 2021-04-21 DIAGNOSIS — D259 Leiomyoma of uterus, unspecified: Secondary | ICD-10-CM | POA: Diagnosis not present

## 2021-05-06 DIAGNOSIS — E785 Hyperlipidemia, unspecified: Secondary | ICD-10-CM | POA: Diagnosis not present

## 2021-05-06 DIAGNOSIS — E559 Vitamin D deficiency, unspecified: Secondary | ICD-10-CM | POA: Diagnosis not present

## 2021-05-06 DIAGNOSIS — D500 Iron deficiency anemia secondary to blood loss (chronic): Secondary | ICD-10-CM | POA: Diagnosis not present

## 2021-06-03 IMAGING — US VENOUS DOPPLER ULTRASOUND OF LEFT LOWER EXTREMITY
1 series · 13 of 24 positions shown · non-contrast
Comparison: None.

CLINICAL DATA: Left calf pain throbbing.  Evaluate for DVT.



[Series 1: venous doppler ultrasound of left lower extremity · 13 of 35 slices shown]
[im 1/35]
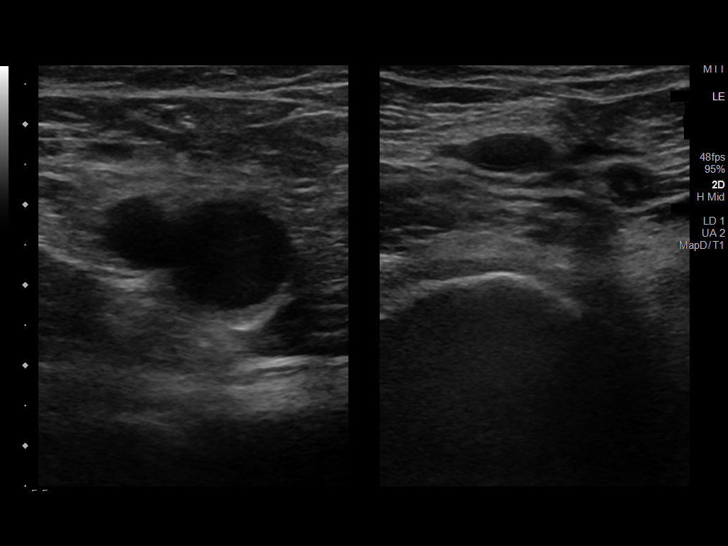
[im 3/35]
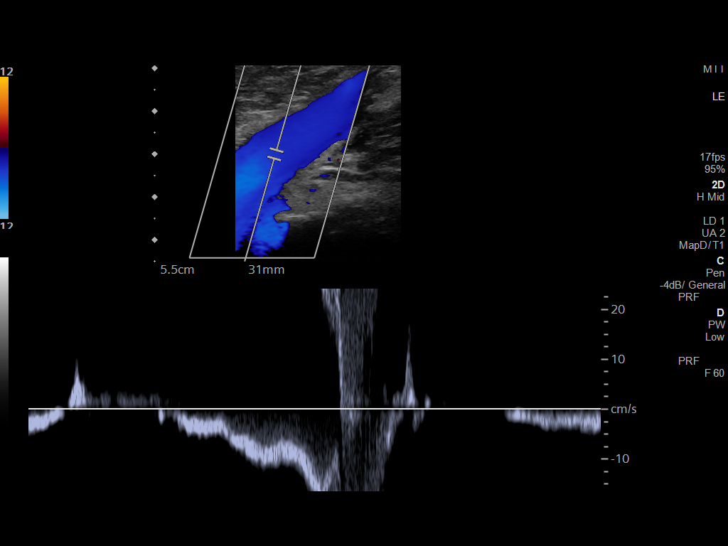
[im 6/35]
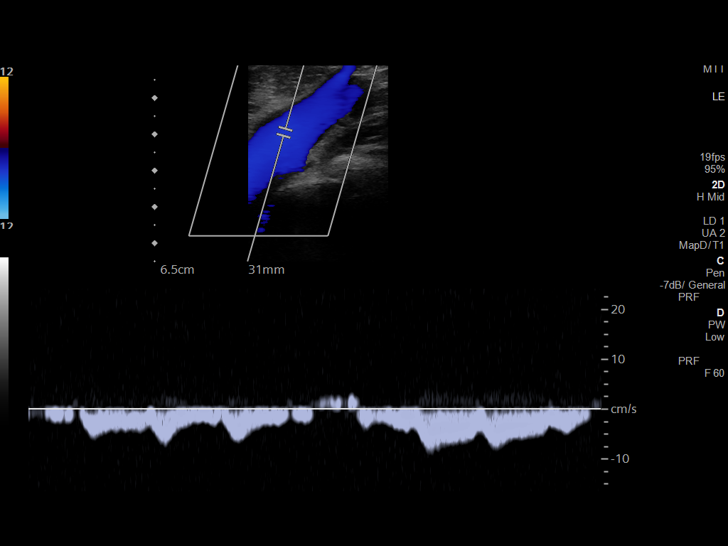
[im 9/35]
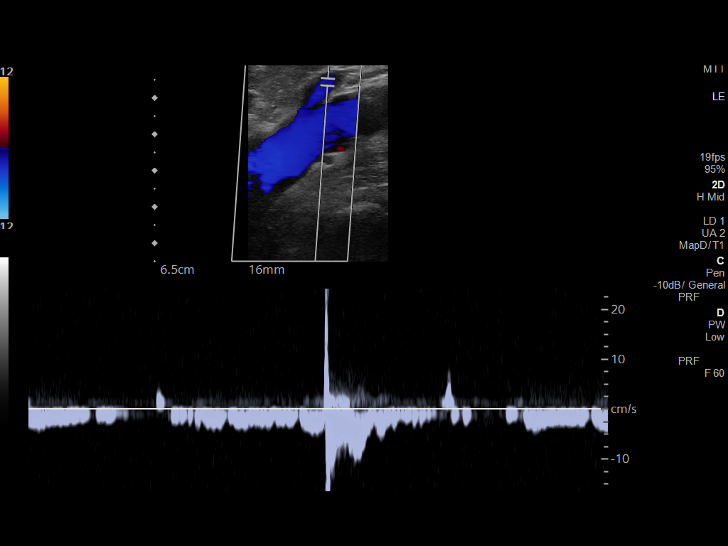
[im 12/35]
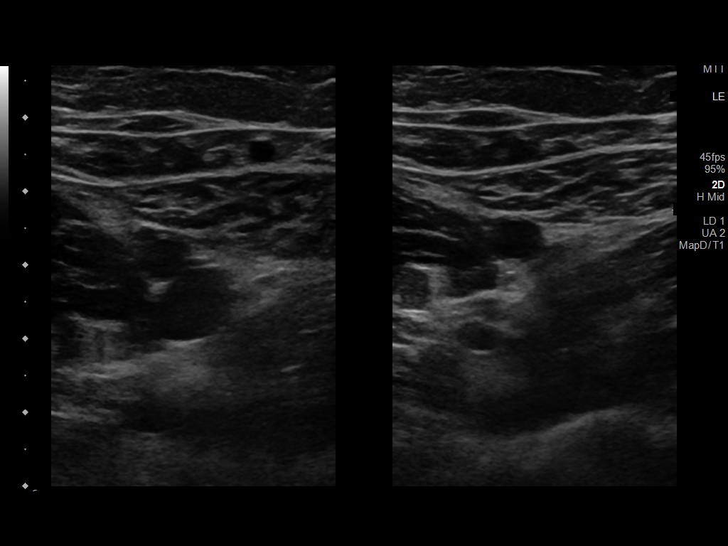
[im 15/35]
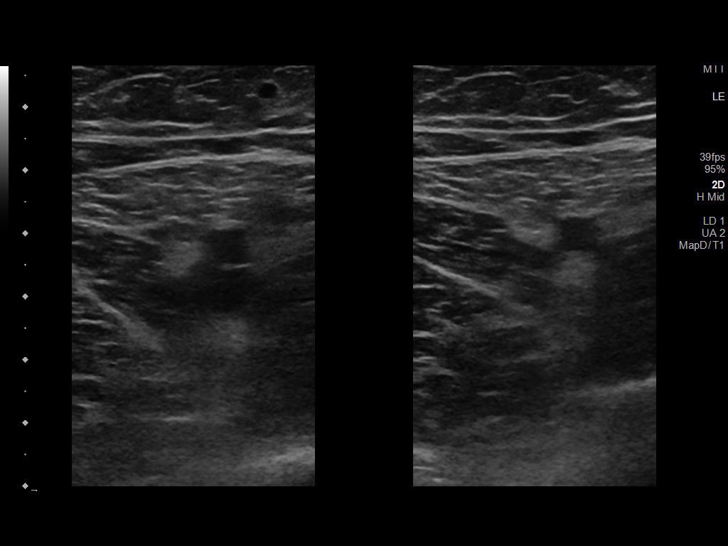
[im 18/35]
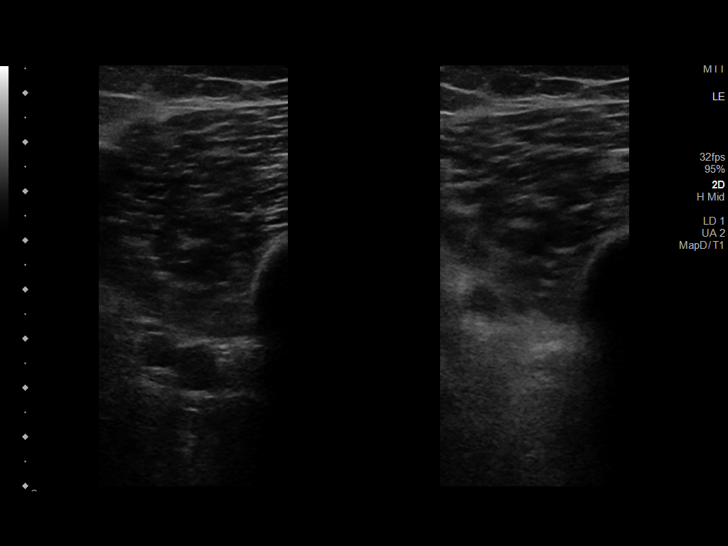
[im 20/35]
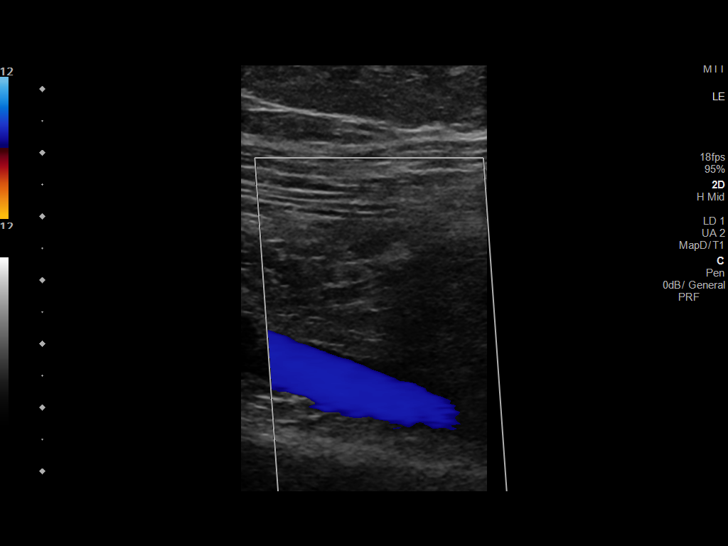
[im 23/35]
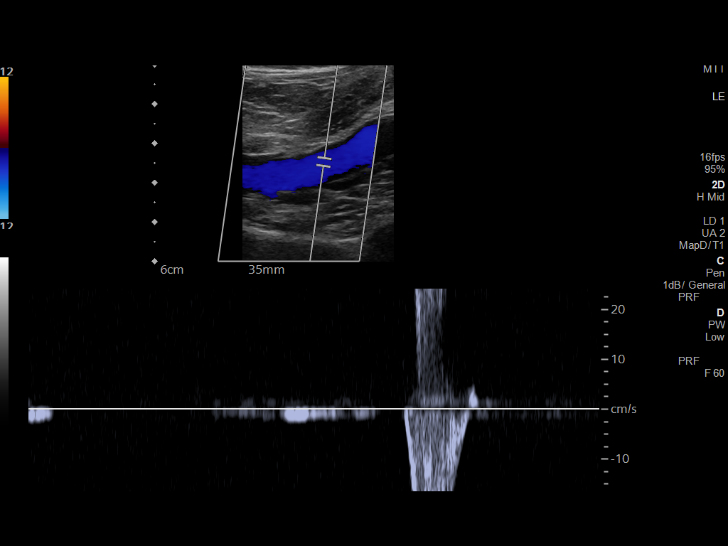
[im 26/35]
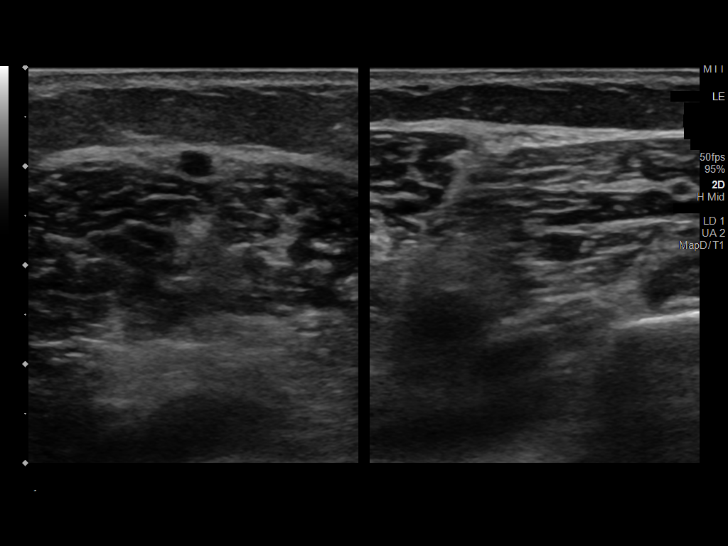
[im 29/35]
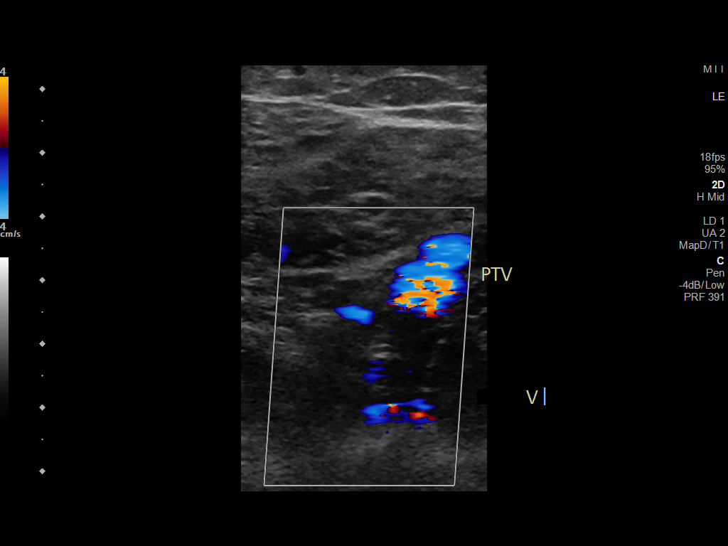
[im 32/35]
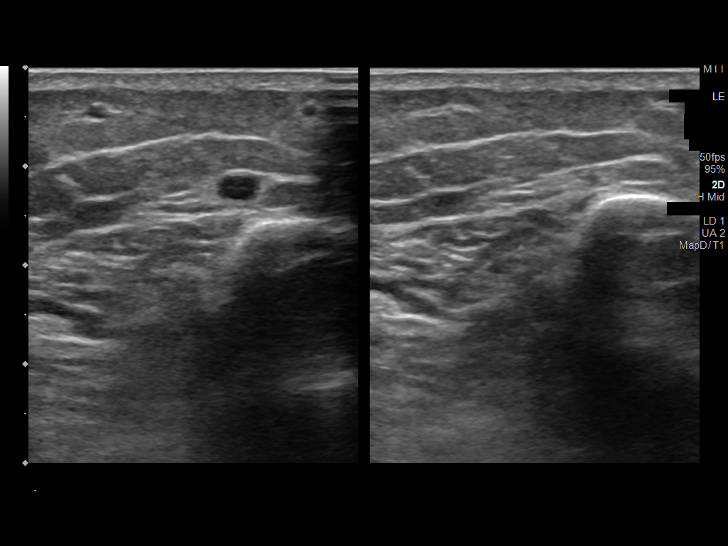
[im 35/35]
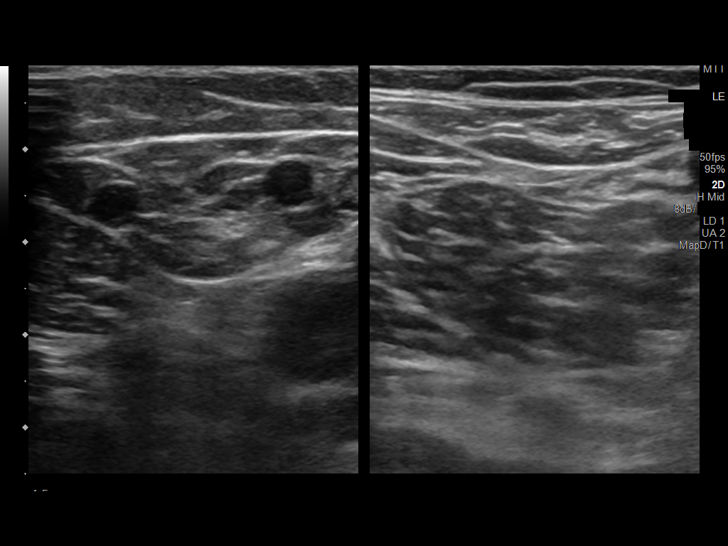

[13 of 24 positions shown; findings below may reference images not displayed]

FINDINGS: Contralateral Common Femoral Vein: Respiratory phasicity is normal
and symmetric with the symptomatic side. No evidence of thrombus.
Normal compressibility.

Common Femoral Vein: No evidence of thrombus. Normal
compressibility, respiratory phasicity and response to augmentation.

Saphenofemoral Junction: No evidence of thrombus. Normal
compressibility and flow on color Doppler imaging.

Profunda Femoral Vein: No evidence of thrombus. Normal
compressibility and flow on color Doppler imaging.

Femoral Vein: No evidence of thrombus. Normal compressibility,
respiratory phasicity and response to augmentation.

Popliteal Vein: No evidence of thrombus. Normal compressibility,
respiratory phasicity and response to augmentation.

Calf Veins: No evidence of thrombus. Normal compressibility and flow
on color Doppler imaging.

Superficial Great Saphenous Vein: No evidence of thrombus. Normal
compressibility.

Venous Reflux:  None.

Other Findings:  None.
IMPRESSION: No evidence of DVT within the left lower extremity.

## 2021-06-18 ENCOUNTER — Encounter (HOSPITAL_BASED_OUTPATIENT_CLINIC_OR_DEPARTMENT_OTHER): Payer: Self-pay | Admitting: Obstetrics and Gynecology

## 2021-06-19 ENCOUNTER — Encounter (HOSPITAL_BASED_OUTPATIENT_CLINIC_OR_DEPARTMENT_OTHER): Payer: Self-pay | Admitting: Obstetrics and Gynecology

## 2021-06-19 ENCOUNTER — Other Ambulatory Visit: Payer: Self-pay

## 2021-06-19 DIAGNOSIS — T8859XA Other complications of anesthesia, initial encounter: Secondary | ICD-10-CM

## 2021-06-19 HISTORY — DX: Other complications of anesthesia, initial encounter: T88.59XA

## 2021-06-19 NOTE — Progress Notes (Signed)
Your procedure is scheduled on Wednesday, Jun 25, 2021.  Report to [facility].   Call this number if you have problems the morning of surgery  :[phone].   OUR ADDRESS IS [address] [place].  WE ARE LOCATED IN [facility].  PLEASE BRING YOUR INSURANCE CARD AND PHOTO ID DAY OF SURGERY.  ONLY 2 PEOPLE ARE ALLOWED IN  WAITING  ROOM.                                      REMEMBER:  DO NOT EAT FOOD, CANDY GUM OR MINTS  AFTER MIDNIGHT THE NIGHT BEFORE YOUR SURGERY . YOU MAY HAVE CLEAR LIQUIDS FROM MIDNIGHT THE NIGHT BEFORE YOUR SURGERY UNTIL  4:30 AM. NO CLEAR LIQUIDS AFTER   4:30 AM DAY OF SURGERY.  YOU MAY  BRUSH YOUR TEETH MORNING OF SURGERY AND RINSE YOUR MOUTH OUT, NO CHEWING GUM CANDY OR MINTS.     CLEAR LIQUID DIET   Foods Allowed                                                                     Foods Excluded  Coffee and tea, regular and decaf                             liquids that you cannot  Plain Jell-O                                                                   see through such as: Fruit ices (not with fruit pulp)                                     milk, soups, orange juice  Plain  Popsicles                                    All solid food Carbonated beverages, regular and diet                                    Cranberry, grape and apple juices Sports drinks like Gatorade _____________________________________________________________________     TAKE THESE MEDICATIONS MORNING OF SURGERY: Levothyroxine, Rosuvastatin, Metoprolol if needed, Imitrex if needed Do not take any other medications, vitamins or supplements the morning of surgery.    UP TO 4 VISITORS  MAY VISIT IN THE EXTENDED RECOVERY ROOM UNTIL 800 PM ONLY.  1 VISITOR AGE 44 AND OVER MAY SPEND THE NIGHT AND MUST BE IN EXTENDED RECOVERY ROOM NO LATER THAN 800 PM . YOUR DISCHARGE TIME AFTER YOU SPEND THE NIGHT IS 900  AM THE MORNING AFTER YOUR  SURGERY.  YOU MAY PACK A SMALL OVERNIGHT BAG WITH TOILETRIES FOR YOUR OVERNIGHT STAY IF YOU WISH.  YOUR PRESCRIPTION MEDICATIONS WILL BE PROVIDED DURING [facility].                                      DO NOT WEAR JEWERLY, MAKE UP. DO NOT WEAR LOTIONS, POWDERS, PERFUMES OR NAIL POLISH ON YOUR FINGERNAILS. TOENAIL POLISH IS OK TO WEAR. DO NOT SHAVE FOR 48 HOURS PRIOR TO DAY OF SURGERY. MEN MAY SHAVE FACE AND NECK. CONTACTS, GLASSES, OR DENTURES MAY NOT BE WORN TO SURGERY.  REMEMBER: NO SMOKING, DRUGS OR ALCOHOL FOR 24 HOURS BEFORE YOUR SURGERY.                                    [facility] IS NOT RESPONSIBLE  FOR ANY BELONGINGS.                                                                    Marland Kitchen           [facility] - Preparing for Surgery Before surgery, you can play an important role.  Because skin is not sterile, your skin needs to be as free of germs as possible.  You can reduce the number of germs on your skin by washing with CHG (chlorahexidine gluconate) soap before surgery.  CHG is an antiseptic cleaner which kills germs and bonds with the skin to continue killing germs even after washing. Please DO NOT use if you have an allergy to CHG or antibacterial soaps.  If your skin becomes reddened/irritated stop using the CHG and inform your nurse when you arrive at Short Stay. Do not shave (including legs and underarms) for at least 48 hours prior to the first CHG shower.  You may shave your face/neck. Please follow these instructions carefully:  1.  Shower with CHG Soap the night before surgery and the  morning of Surgery.  2.  If you choose to wash your hair, wash your hair first as usual with your  normal  shampoo.  3.  After you shampoo, rinse your hair and body thoroughly to remove the  shampoo.                            4.  Use CHG as you would any other liquid soap.  You can apply chg directly  to the skin and wash , please wash your belly button thoroughly with chg soap  provided night before and morning of your surgery.                     Gently with a scrungie or clean washcloth.  5.  Apply the CHG Soap to your body ONLY FROM THE NECK DOWN.   Do not use on face/ open                           Wound or open sores. Avoid contact  with eyes, ears mouth and genitals (private parts).                       Wash face,  Genitals (private parts) with your normal soap.             6.  Wash thoroughly, paying special attention to the area where your surgery  will be performed.  7.  Thoroughly rinse your body with warm water from the neck down.  8.  DO NOT shower/wash with your normal soap after using and rinsing off  the CHG Soap.                9.  Pat yourself dry with a clean towel.            10.  Wear clean pajamas.            11.  Place clean sheets on your bed the night of your first shower and do not  sleep with pets. Day of Surgery : Do not apply any lotions/deodorants the morning of surgery.  Please wear clean clothes to the hospital/surgery center.  IF YOU HAVE ANY SKIN IRRITATION OR PROBLEMS WITH THE SURGICAL SOAP, PLEASE GET A BAR OF GOLD DIAL SOAP AND SHOWER THE NIGHT BEFORE YOUR SURGERY AND THE MORNING OF YOUR SURGERY. PLEASE LET THE NURSE KNOW MORNING OF YOUR SURGERY IF YOU HAD ANY PROBLEMS WITH THE SURGICAL SOAP.   ________________________________________________________________________                                                        QUESTIONS [facility] PRE OP NURSE PHONE [phone].

## 2021-06-19 NOTE — Progress Notes (Signed)
Spoke w/ via phone for pre-op interview---[facility] needs dos----urine pregnancy POCT               Lab results------06/23/21 lab appt for CBC w/diff, type & screen, BMP, 12/17/17 Echocardiogram LVEF 55 - 60% in Epic COVID test -----patient states asymptomatic no test needed Arrive at -------0530 on Wednesday, 06/25/21 NPO after MN NO Solid Food.  Clear liquids from MN until---0430 Med rec completed Medications to take morning of surgery -----Levothyroxine, Metoprolol prn, rosuvastatin, Imitrex prn Diabetic medication -----n/a Patient instructed no nail polish to be worn day of surgery Patient instructed to bring photo id and insurance card day of surgery Patient aware to have Driver (ride ) / caregiver    for 24 hours after surgery - husband, Warner Mccreedy and/or Mom Patient Special Instructions -----Extended / Overnight stay instructions given. Pre-Op special Istructions -----none Patient verbalized understanding of instructions that were given at this phone interview. Patient denies shortness of breath, chest pain, fever, cough at this phone interview.

## 2021-06-23 ENCOUNTER — Encounter (HOSPITAL_COMMUNITY)
Admission: RE | Admit: 2021-06-23 | Discharge: 2021-06-23 | Disposition: A | Discharge status: Home / Self Care | Payer: 59 | Source: Ambulatory Visit | Attending: Obstetrics and Gynecology | Admitting: Obstetrics and Gynecology

## 2021-06-23 DIAGNOSIS — D259 Leiomyoma of uterus, unspecified: Secondary | ICD-10-CM | POA: Diagnosis not present

## 2021-06-23 DIAGNOSIS — Z01812 Encounter for preprocedural laboratory examination: Secondary | ICD-10-CM | POA: Diagnosis not present

## 2021-06-23 DIAGNOSIS — Z87891 Personal history of nicotine dependence: Secondary | ICD-10-CM | POA: Diagnosis not present

## 2021-06-23 DIAGNOSIS — N888 Other specified noninflammatory disorders of cervix uteri: Secondary | ICD-10-CM | POA: Diagnosis not present

## 2021-06-23 DIAGNOSIS — E039 Hypothyroidism, unspecified: Secondary | ICD-10-CM | POA: Diagnosis not present

## 2021-06-23 DIAGNOSIS — Z01818 Encounter for other preprocedural examination: Secondary | ICD-10-CM

## 2021-06-23 DIAGNOSIS — N939 Abnormal uterine and vaginal bleeding, unspecified: Secondary | ICD-10-CM | POA: Diagnosis not present

## 2021-06-23 LAB — CBC WITH DIFFERENTIAL/PLATELET
Abs Immature Granulocytes: 0.02 10*3/uL (ref 0.00–0.07)
Basophils Absolute: 0.1 10*3/uL (ref 0.0–0.1)
Basophils Relative: 1 %
Eosinophils Absolute: 0.3 10*3/uL (ref 0.0–0.5)
Eosinophils Relative: 3 %
HCT: 42.3 % (ref 36.0–46.0)
Hemoglobin: 14.3 g/dL (ref 12.0–15.0)
Immature Granulocytes: 0 %
Lymphocytes Relative: 24 %
Lymphs Abs: 2 10*3/uL (ref 0.7–4.0)
MCH: 31.2 pg (ref 26.0–34.0)
MCHC: 33.8 g/dL (ref 30.0–36.0)
MCV: 92.2 fL (ref 80.0–100.0)
Monocytes Absolute: 0.6 10*3/uL (ref 0.1–1.0)
Monocytes Relative: 8 %
Neutro Abs: 5.3 10*3/uL (ref 1.7–7.7)
Neutrophils Relative %: 64 %
Platelets: 307 10*3/uL (ref 150–400)
RBC: 4.59 MIL/uL (ref 3.87–5.11)
RDW: 12.6 % (ref 11.5–15.5)
WBC: 8.3 10*3/uL (ref 4.0–10.5)
nRBC: 0 % (ref 0.0–0.2)

## 2021-06-23 LAB — BASIC METABOLIC PANEL
Anion gap: 7 (ref 5–15)
BUN: 18 mg/dL (ref 6–20)
CO2: 28 mmol/L (ref 22–32)
Calcium: 9.3 mg/dL (ref 8.9–10.3)
Chloride: 103 mmol/L (ref 98–111)
Creatinine, Ser: 0.58 mg/dL (ref 0.44–1.00)
GFR, Estimated: >60 mL/min (ref >60)
Glucose, Bld: 99 mg/dL (ref 70–99)
Potassium: 3.7 mmol/L (ref 3.5–5.1)
Sodium: 138 mmol/L (ref 135–145)

## 2021-06-24 NOTE — Anesthesia Preprocedure Evaluation (Addendum)
Anesthesia Evaluation  Patient identified by MRN, date of birth, ID band Patient awake    Reviewed: Allergy & Precautions, NPO status , Patient's Chart, lab work & pertinent test results  History of Anesthesia Complications Negative for: history of anesthetic complications  Airway Mallampati: II  TM Distance: >3 FB Neck ROM: Full    Dental no notable dental hx.    Pulmonary former smoker,    Pulmonary exam normal        Cardiovascular negative cardio ROS Normal cardiovascular exam     Neuro/Psych  Headaches, negative psych ROS   GI/Hepatic negative GI ROS, Neg liver ROS,   Endo/Other  Hypothyroidism   Renal/GU negative Renal ROS  negative genitourinary   Musculoskeletal negative musculoskeletal ROS (+)   Abdominal   Peds  Hematology negative hematology ROS (+)   Anesthesia Other Findings Day of surgery medications reviewed with patient.  Reproductive/Obstetrics uterine leiomyoma                           Anesthesia Physical Anesthesia Plan  ASA: 2  Anesthesia Plan: General   Post-op Pain Management: Tylenol PO (pre-op)*, Toradol IV (intra-op)* and Precedex   Induction:   PONV Risk Score and Plan: 4 or greater and Midazolam, Scopolamine patch - Pre-op, Treatment may vary due to age or medical condition, Ondansetron and Dexamethasone  Airway Management Planned: Oral ETT  Additional Equipment: None  Intra-op Plan:   Post-operative Plan: Extubation in OR  Informed Consent: I have reviewed the patients History and Physical, chart, labs and discussed the procedure including the risks, benefits and alternatives for the proposed anesthesia with the patient or authorized representative who has indicated his/her understanding and acceptance.     Dental advisory given  Plan Discussed with: CRNA  Anesthesia Plan Comments:       Anesthesia Quick Evaluation

## 2021-06-24 NOTE — H&P (Incomplete)
Theresa Schwartz is an 51 y.o. female P2 with abnormal uterine bleeding and 11 week size multifibroid uterus. She has failed medical management with OCPs and progesterone. She also experiences lower back pressure which may be from fibroid.  04/14/21 pelvic US Uterus  Measurements: 11.1 x 6.2 x 7.6 cm = volume: 270 mL.   3 fibroids are noted.    Fibroid 1 is located in the posterior uterine body measuring 4.3 x 3.5 x 3.5 cm.    Fibroid 2 is located in the uterine fundus measuring 2.1 x 1.6 x 2.0 cm.    Fibroid 3 is located in the posterior uterine fundus measuring 1.5 x 1.3 x 1.5 cm.    Endometrium    Thickness: 5 mm. No focal abnormality visualized.    Right ovary    Measurements: 4.6 x 2.7 x 2.5 cm = volume: 16.6 mL. 2.5 cm dominant follicle is seen. Normal appearance.    Left ovary    Not visualized.    Other findings    No abnormal free fluid.     Pertinent Gynecological History: Last pap: normal Date: 02/04/21 OB History: G2, P2   Menstrual History: No LMP recorded.    Past Medical History:  Diagnosis Date   Anemia    iron deficiency anemia   Complication of anesthesia 06/19/2021   Patient stated that after having a bunionectomy she woke up and couldn't catch her breath. She stated that her mouth was super dry and she had sort of a panic attack. Also, patient stated that she was very hard to wake up after having widsom teeth removed.   History of kidney stones    Patient takes Dyazide for kidney stone prevention per pt on 06/19/21.   Hypothyroidism 12/26/2012   replacement therapy   Interstitial cystitis 12/26/2012   Followed by Dr. Vikki Ports, resolved as of 06/19/21 per pt   Menorrhagia    Migraines    Takes Imitrex as needed.   Mitral valve prolapse syndrome 12/26/2012   Other physical therapy    for loss of curvature in her cervical spine   Palpitations 12/26/2012   Patient takes Metoprolol as needed.   Pilar cysts    multiple over the years as of 06/18/21   Uterine  leiomyoma    Vitamin D deficiency     Past Surgical History:  Procedure Laterality Date   BUNIONECTOMY Bilateral    around 2017 left foot, around 2018 right foot   CESAREAN SECTION     2000 & 2003   CYSTOSCOPY     around 2012   FINGER SURGERY Left 2012   index finger   WISDOM TOOTH EXTRACTION  2010    Family History  Problem Relation Age of Onset   Hypertension Father    Arthritis/Rheumatoid Father    Healthy Mother    Healthy Sister     Social History:  reports that she quit smoking about 24 years ago. Her smoking use included cigarettes. She has a 1.25 pack-year smoking history. She has never used smokeless tobacco. She reports that she does not drink alcohol and does not use drugs.  Allergies:  Allergies  Allergen Reactions   Cymbalta [Duloxetine Hcl] Other (See Comments)    REACTION: "made me depressed"   Doxycycline Other (See Comments)    REACTION: :swollen hot throat; itching"   Tramadol Itching   Sulfa Antibiotics Other (See Comments)    REACTION: "told by mother had allergy"    No medications prior to admission.  Review of Systems  Constitutional:  Negative for fever.  HENT:  Negative for sore throat.   Respiratory:  Negative for shortness of breath.   Cardiovascular:  Negative for chest pain.  Gastrointestinal:  Negative for abdominal pain.  Genitourinary:  Positive for vaginal bleeding.  Musculoskeletal:  Positive for back pain.  Skin:  Negative for rash.  Neurological:  Negative for headaches.  Psychiatric/Behavioral:  Negative for confusion.    Height '5\' 4"'$  (1.626 m), weight 71.7 kg. Physical Exam  Constitutional General Appearance: healthy-appearing, well-nourished, well-developed  Psychiatric Orientation: to time, to place, to person Mood and Affect: active and alert, normal mood, normal affect  Abdomen Auscultation/Inspection/Palpation: normal bowel sounds, soft, non-distended, no tenderness, no hepatomegaly, no splenomegaly, no  masses, no CVA tenderness Hernia: none palpated  Female Genitalia Vulva: no masses, no atrophy, no lesions Bladder/Urethra: normal meatus, no urethral discharge, no urethral mass, bladder non distended Vagina no tenderness, no erythema, no abnormal vaginal discharge, no vesicle(s) or ulcers, no cystocele, no rectocele (blood present) Cervix: grossly normal, no discharge, no cervical motion tenderness Uterus: midline, mobile, non-tender, no uterine prolapse, contour irregular, fibroids (11 week size uterus, mobile, posterior fibroid palpable) Adnexa/Parametria: no parametrial tenderness, no parametrial mass, no adnexal tenderness, no ovarian mass    No results found for this or any previous visit (from the past 24 hour(s)).  No results found.  Assessment/Plan: 50Y P2 with abnormal uterine bleeding - leiomyoma - Plan: robot assisted total laparoscopic hysterectomy, bilateral salpingectomy, cystoscopy, possible unilateral or bilateral oophorectomy - Informed consent obtained. Reviewed risk of infection, bleeding, damage to surrounding organs, laparotomy, failure to achieve desired result. Consent signed.  - Ancef 2g  Rowland Lathe 06/24/2021, 8:43 PM

## 2021-06-25 ENCOUNTER — Ambulatory Visit (HOSPITAL_BASED_OUTPATIENT_CLINIC_OR_DEPARTMENT_OTHER): Payer: 59 | Admitting: Anesthesiology

## 2021-06-25 ENCOUNTER — Ambulatory Visit (HOSPITAL_BASED_OUTPATIENT_CLINIC_OR_DEPARTMENT_OTHER)
Admission: RE | Admit: 2021-06-25 | Discharge: 2021-06-26 | Disposition: A | Discharge status: Home / Self Care | Payer: 59 | Attending: Obstetrics and Gynecology | Admitting: Obstetrics and Gynecology

## 2021-06-25 ENCOUNTER — Other Ambulatory Visit: Payer: Self-pay

## 2021-06-25 ENCOUNTER — Encounter (HOSPITAL_BASED_OUTPATIENT_CLINIC_OR_DEPARTMENT_OTHER): Payer: Self-pay | Admitting: Obstetrics and Gynecology

## 2021-06-25 ENCOUNTER — Encounter (HOSPITAL_BASED_OUTPATIENT_CLINIC_OR_DEPARTMENT_OTHER)
Admission: RE | Disposition: A | Discharge status: Home / Self Care | Payer: Self-pay | Source: Home / Self Care | Attending: Obstetrics and Gynecology

## 2021-06-25 DIAGNOSIS — D259 Leiomyoma of uterus, unspecified: Secondary | ICD-10-CM | POA: Insufficient documentation

## 2021-06-25 DIAGNOSIS — N939 Abnormal uterine and vaginal bleeding, unspecified: Secondary | ICD-10-CM | POA: Diagnosis not present

## 2021-06-25 DIAGNOSIS — E039 Hypothyroidism, unspecified: Secondary | ICD-10-CM | POA: Diagnosis not present

## 2021-06-25 DIAGNOSIS — Z87891 Personal history of nicotine dependence: Secondary | ICD-10-CM | POA: Diagnosis not present

## 2021-06-25 DIAGNOSIS — D250 Submucous leiomyoma of uterus: Secondary | ICD-10-CM | POA: Diagnosis not present

## 2021-06-25 DIAGNOSIS — Z01818 Encounter for other preprocedural examination: Secondary | ICD-10-CM

## 2021-06-25 DIAGNOSIS — N888 Other specified noninflammatory disorders of cervix uteri: Secondary | ICD-10-CM | POA: Insufficient documentation

## 2021-06-25 DIAGNOSIS — D251 Intramural leiomyoma of uterus: Secondary | ICD-10-CM | POA: Diagnosis not present

## 2021-06-25 DIAGNOSIS — Z90710 Acquired absence of both cervix and uterus: Secondary | ICD-10-CM | POA: Diagnosis present

## 2021-06-25 DIAGNOSIS — D252 Subserosal leiomyoma of uterus: Secondary | ICD-10-CM | POA: Diagnosis not present

## 2021-06-25 HISTORY — DX: Personal history of urinary calculi: Z87.442

## 2021-06-25 HISTORY — DX: Anemia, unspecified: D64.9

## 2021-06-25 HISTORY — DX: Unspecified osteoarthritis, unspecified site: M19.90

## 2021-06-25 HISTORY — PX: CYSTOSCOPY: SHX5120

## 2021-06-25 HISTORY — PX: ROBOTIC ASSISTED LAPAROSCOPIC LYSIS OF ADHESION: SHX6080

## 2021-06-25 HISTORY — DX: Pilar cyst: L72.11

## 2021-06-25 HISTORY — DX: Vitamin D deficiency, unspecified: E55.9

## 2021-06-25 HISTORY — DX: Leiomyoma of uterus, unspecified: D25.9

## 2021-06-25 HISTORY — DX: Excessive and frequent menstruation with regular cycle: N92.0

## 2021-06-25 HISTORY — DX: Migraine, unspecified, not intractable, without status migrainosus: G43.909

## 2021-06-25 HISTORY — PX: ROBOTIC ASSISTED TOTAL HYSTERECTOMY WITH BILATERAL SALPINGO OOPHERECTOMY: SHX6086

## 2021-06-25 LAB — TYPE AND SCREEN
ABO/RH(D): O POS
Antibody Screen: NEGATIVE

## 2021-06-25 LAB — ABO/RH: ABO/RH(D): O POS

## 2021-06-25 LAB — POCT PREGNANCY, URINE: Preg Test, Ur: NEGATIVE

## 2021-06-25 SURGERY — HYSTERECTOMY, TOTAL, ROBOT-ASSISTED, LAPAROSCOPIC, WITH BILATERAL SALPINGO-OOPHORECTOMY
Anesthesia: General

## 2021-06-25 MED ORDER — ROCURONIUM BROMIDE 100 MG/10ML IV SOLN
INTRAVENOUS | Status: DC | PRN
  Administered 2021-06-25 (×2): 20 mg via INTRAVENOUS
  Administered 2021-06-25: 60 mg via INTRAVENOUS
  Administered 2021-06-25: 20 mg via INTRAVENOUS

## 2021-06-25 MED ORDER — DIPHENHYDRAMINE HCL 25 MG PO CAPS
25.0000 mg | ORAL_CAPSULE | Freq: Four times a day (QID) | ORAL | Status: DC | PRN
  Administered 2021-06-25: 25 mg via ORAL

## 2021-06-25 MED ORDER — DEXMEDETOMIDINE (PRECEDEX) IN NS 20 MCG/5ML (4 MCG/ML) IV SYRINGE
PREFILLED_SYRINGE | INTRAVENOUS | Status: DC | PRN
  Administered 2021-06-25 (×5): 4 ug via INTRAVENOUS

## 2021-06-25 MED ORDER — SCOPOLAMINE 1 MG/3DAYS TD PT72
1.0000 | MEDICATED_PATCH | Freq: Once | TRANSDERMAL | Status: DC
  Administered 2021-06-25: 1.5 mg via TRANSDERMAL

## 2021-06-25 MED ORDER — POVIDONE-IODINE 10 % EX SOLN
Freq: Once | CUTANEOUS | Status: AC
  Filled 2021-06-25: qty 473

## 2021-06-25 MED ORDER — ONDANSETRON HCL 4 MG/2ML IJ SOLN
4.0000 mg | Freq: Four times a day (QID) | INTRAMUSCULAR | Status: DC | PRN
Start: 2021-06-25 — End: 2021-06-26

## 2021-06-25 MED ORDER — ACETAMINOPHEN 10 MG/ML IV SOLN
INTRAVENOUS | Status: AC
  Filled 2021-06-25: qty 100

## 2021-06-25 MED ORDER — ONDANSETRON HCL 4 MG/2ML IJ SOLN
INTRAMUSCULAR | Status: AC
Start: 2021-06-25 — End: ?
  Filled 2021-06-25: qty 2

## 2021-06-25 MED ORDER — OXYCODONE HCL 5 MG PO TABS
ORAL_TABLET | ORAL | Status: AC
  Filled 2021-06-25: qty 2

## 2021-06-25 MED ORDER — LIDOCAINE HCL (PF) 2 % IJ SOLN
INTRAMUSCULAR | Status: AC
  Filled 2021-06-25: qty 5

## 2021-06-25 MED ORDER — MIDAZOLAM HCL 2 MG/2ML IJ SOLN
INTRAMUSCULAR | Status: AC
  Filled 2021-06-25: qty 2

## 2021-06-25 MED ORDER — ACETAMINOPHEN 500 MG PO TABS: 1000.0000 mg | ORAL_TABLET | Freq: Once | ORAL | Status: DC

## 2021-06-25 MED ORDER — METOPROLOL SUCCINATE ER 25 MG PO TB24
25.0000 mg | ORAL_TABLET | Freq: Once | ORAL | Status: DC | PRN
  Filled 2021-06-25 (×2): qty 1

## 2021-06-25 MED ORDER — FLUORESCEIN SODIUM 10 % IV SOLN
INTRAVENOUS | Status: AC
  Filled 2021-06-25: qty 5

## 2021-06-25 MED ORDER — KETOROLAC TROMETHAMINE 30 MG/ML IJ SOLN
INTRAMUSCULAR | Status: AC
  Filled 2021-06-25: qty 1

## 2021-06-25 MED ORDER — SUMATRIPTAN SUCCINATE 50 MG PO TABS
50.0000 mg | ORAL_TABLET | Freq: Every day | ORAL | Status: DC | PRN
  Filled 2021-06-25 (×2): qty 1

## 2021-06-25 MED ORDER — IBUPROFEN 200 MG PO TABS: 600.0000 mg | ORAL_TABLET | Freq: Four times a day (QID) | ORAL | Status: DC

## 2021-06-25 MED ORDER — POLYETHYLENE GLYCOL 3350 17 G PO PACK: 17.0000 g | PACK | Freq: Every day | ORAL | Status: DC | PRN

## 2021-06-25 MED ORDER — HYDROMORPHONE HCL 1 MG/ML IJ SOLN
0.2000 mg | INTRAMUSCULAR | Status: DC | PRN
  Administered 2021-06-26: 0.5 mg via INTRAVENOUS

## 2021-06-25 MED ORDER — DEXMEDETOMIDINE (PRECEDEX) IN NS 20 MCG/5ML (4 MCG/ML) IV SYRINGE
PREFILLED_SYRINGE | INTRAVENOUS | Status: AC
  Filled 2021-06-25: qty 5

## 2021-06-25 MED ORDER — SCOPOLAMINE 1 MG/3DAYS TD PT72
MEDICATED_PATCH | TRANSDERMAL | Status: AC
  Filled 2021-06-25: qty 1

## 2021-06-25 MED ORDER — DEXAMETHASONE SODIUM PHOSPHATE 10 MG/ML IJ SOLN
INTRAMUSCULAR | Status: AC
Start: 2021-06-25 — End: ?
  Filled 2021-06-25: qty 1

## 2021-06-25 MED ORDER — DEXAMETHASONE SODIUM PHOSPHATE 4 MG/ML IJ SOLN
INTRAMUSCULAR | Status: DC | PRN
  Administered 2021-06-25: 10 mg via INTRAVENOUS

## 2021-06-25 MED ORDER — STERILE WATER FOR IRRIGATION IR SOLN
Status: DC | PRN
  Administered 2021-06-25: 500 mL

## 2021-06-25 MED ORDER — HYDROMORPHONE HCL 1 MG/ML IJ SOLN
INTRAMUSCULAR | Status: AC
  Filled 2021-06-25: qty 1

## 2021-06-25 MED ORDER — KETOROLAC TROMETHAMINE 30 MG/ML IJ SOLN
INTRAMUSCULAR | Status: DC | PRN
  Administered 2021-06-25: 30 mg via INTRAVENOUS

## 2021-06-25 MED ORDER — ACETAMINOPHEN 500 MG PO TABS
ORAL_TABLET | ORAL | Status: AC
  Filled 2021-06-25: qty 2

## 2021-06-25 MED ORDER — SIMETHICONE 80 MG PO CHEW
80.0000 mg | CHEWABLE_TABLET | Freq: Four times a day (QID) | ORAL | Status: DC | PRN
  Administered 2021-06-26: 80 mg via ORAL

## 2021-06-25 MED ORDER — KETOROLAC TROMETHAMINE 30 MG/ML IJ SOLN
30.0000 mg | Freq: Four times a day (QID) | INTRAMUSCULAR | Status: DC
  Administered 2021-06-25 – 2021-06-26 (×3): 30 mg via INTRAVENOUS

## 2021-06-25 MED ORDER — FENTANYL CITRATE (PF) 250 MCG/5ML IJ SOLN
INTRAMUSCULAR | Status: AC
  Filled 2021-06-25: qty 5

## 2021-06-25 MED ORDER — ONDANSETRON HCL 4 MG PO TABS: 4.0000 mg | ORAL_TABLET | Freq: Four times a day (QID) | ORAL | Status: DC | PRN

## 2021-06-25 MED ORDER — LACTATED RINGERS IV SOLN
INTRAVENOUS | Status: DC
  Administered 2021-06-25: 1 mL via INTRAVENOUS

## 2021-06-25 MED ORDER — ROCURONIUM BROMIDE 10 MG/ML (PF) SYRINGE
PREFILLED_SYRINGE | INTRAVENOUS | Status: AC
  Filled 2021-06-25: qty 10

## 2021-06-25 MED ORDER — ONDANSETRON HCL 4 MG/2ML IJ SOLN
INTRAMUSCULAR | Status: DC | PRN
  Administered 2021-06-25: 4 mg via INTRAVENOUS

## 2021-06-25 MED ORDER — ROPIVACAINE HCL 5 MG/ML IJ SOLN
INTRAMUSCULAR | Status: DC | PRN
Start: 2021-06-25 — End: 2021-06-25
  Administered 2021-06-25: 6 mL

## 2021-06-25 MED ORDER — FLUORESCEIN SODIUM 10 % IV SOLN
INTRAVENOUS | Status: DC | PRN
  Administered 2021-06-25: .5 mL via INTRAVENOUS

## 2021-06-25 MED ORDER — PROPOFOL 10 MG/ML IV BOLUS
INTRAVENOUS | Status: AC
  Filled 2021-06-25: qty 20

## 2021-06-25 MED ORDER — FENTANYL CITRATE (PF) 100 MCG/2ML IJ SOLN
INTRAMUSCULAR | Status: AC
Start: 2021-06-25 — End: ?
  Filled 2021-06-25: qty 2

## 2021-06-25 MED ORDER — FENTANYL CITRATE (PF) 100 MCG/2ML IJ SOLN
INTRAMUSCULAR | Status: DC | PRN
  Administered 2021-06-25: 100 ug via INTRAVENOUS
  Administered 2021-06-25 (×3): 50 ug via INTRAVENOUS

## 2021-06-25 MED ORDER — OXYCODONE HCL 5 MG PO TABS
5.0000 mg | ORAL_TABLET | ORAL | Status: DC | PRN
  Administered 2021-06-25 (×3): 10 mg via ORAL
  Administered 2021-06-26 (×2): 5 mg via ORAL

## 2021-06-25 MED ORDER — SUGAMMADEX SODIUM 200 MG/2ML IV SOLN
INTRAVENOUS | Status: DC | PRN
  Administered 2021-06-25: 200 mg via INTRAVENOUS

## 2021-06-25 MED ORDER — LEVOTHYROXINE SODIUM 25 MCG PO TABS
25.0000 ug | ORAL_TABLET | Freq: Every day | ORAL | Status: DC
  Administered 2021-06-26: 25 ug via ORAL
  Filled 2021-06-25: qty 1

## 2021-06-25 MED ORDER — DROPERIDOL 2.5 MG/ML IJ SOLN: 0.6250 mg | Freq: Once | INTRAMUSCULAR | Status: DC | PRN

## 2021-06-25 MED ORDER — DIPHENHYDRAMINE HCL 25 MG PO CAPS
ORAL_CAPSULE | ORAL | Status: AC
  Filled 2021-06-25: qty 1

## 2021-06-25 MED ORDER — DOCUSATE SODIUM 100 MG PO CAPS
ORAL_CAPSULE | ORAL | Status: AC
  Filled 2021-06-25: qty 1

## 2021-06-25 MED ORDER — MENTHOL 3 MG MT LOZG: 1.0000 | LOZENGE | OROMUCOSAL | Status: DC | PRN

## 2021-06-25 MED ORDER — CEFAZOLIN SODIUM-DEXTROSE 2-4 GM/100ML-% IV SOLN
2.0000 g | INTRAVENOUS | Status: AC
  Administered 2021-06-25: 2 g via INTRAVENOUS

## 2021-06-25 MED ORDER — HYDROMORPHONE HCL 1 MG/ML IJ SOLN
0.2500 mg | INTRAMUSCULAR | Status: DC | PRN
  Administered 2021-06-25 (×5): 0.25 mg via INTRAVENOUS

## 2021-06-25 MED ORDER — ROSUVASTATIN CALCIUM 10 MG PO TABS
10.0000 mg | ORAL_TABLET | Freq: Every day | ORAL | Status: DC
  Administered 2021-06-25: 10 mg via ORAL
  Filled 2021-06-25 (×2): qty 1

## 2021-06-25 MED ORDER — TRIAMTERENE-HCTZ 37.5-25 MG PO CAPS
1.0000 | ORAL_CAPSULE | Freq: Every day | ORAL | Status: DC
  Administered 2021-06-25: 1 via ORAL
  Filled 2021-06-25: qty 1

## 2021-06-25 MED ORDER — PROPOFOL 10 MG/ML IV BOLUS
INTRAVENOUS | Status: DC | PRN
  Administered 2021-06-25: 140 mg via INTRAVENOUS
  Administered 2021-06-25: 60 mg via INTRAVENOUS

## 2021-06-25 MED ORDER — LACTATED RINGERS IV SOLN: INTRAVENOUS | Status: DC

## 2021-06-25 MED ORDER — SODIUM CHLORIDE 0.9 % IR SOLN
Status: DC | PRN
  Administered 2021-06-25: 1000 mL

## 2021-06-25 MED ORDER — CEFAZOLIN SODIUM-DEXTROSE 2-4 GM/100ML-% IV SOLN
INTRAVENOUS | Status: AC
  Filled 2021-06-25: qty 100

## 2021-06-25 MED ORDER — MIDAZOLAM HCL 5 MG/5ML IJ SOLN
INTRAMUSCULAR | Status: DC | PRN
  Administered 2021-06-25: 1 mg via INTRAVENOUS
  Administered 2021-06-25: 2 mg via INTRAVENOUS
  Administered 2021-06-25: 1 mg via INTRAVENOUS

## 2021-06-25 MED ORDER — ACETAMINOPHEN 10 MG/ML IV SOLN
1000.0000 mg | Freq: Four times a day (QID) | INTRAVENOUS | Status: DC
  Administered 2021-06-25: 1000 mg via INTRAVENOUS

## 2021-06-25 MED ORDER — LIDOCAINE HCL (CARDIAC) PF 100 MG/5ML IV SOSY
PREFILLED_SYRINGE | INTRAVENOUS | Status: DC | PRN
  Administered 2021-06-25: 80 mg via INTRAVENOUS

## 2021-06-25 MED ORDER — ACETAMINOPHEN 500 MG PO TABS
1000.0000 mg | ORAL_TABLET | Freq: Four times a day (QID) | ORAL | Status: DC
  Administered 2021-06-25 – 2021-06-26 (×3): 1000 mg via ORAL

## 2021-06-25 MED ORDER — ACETAMINOPHEN 500 MG PO TABS
1000.0000 mg | ORAL_TABLET | ORAL | Status: AC
  Administered 2021-06-25: 1000 mg via ORAL

## 2021-06-25 MED ORDER — DOCUSATE SODIUM 100 MG PO CAPS
100.0000 mg | ORAL_CAPSULE | Freq: Two times a day (BID) | ORAL | Status: DC
  Administered 2021-06-25: 100 mg via ORAL

## 2021-06-25 SURGICAL SUPPLY — 47 items
ADH SKN CLS APL DERMABOND .7 (GAUZE/BANDAGES/DRESSINGS) ×3
COVER BACK TABLE 60X90IN (DRAPES) ×4 IMPLANT
COVER TIP SHEARS 8 DVNC (MISCELLANEOUS) ×3 IMPLANT
COVER TIP SHEARS 8MM DA VINCI (MISCELLANEOUS) ×4
DEFOGGER SCOPE WARMER CLEARIFY (MISCELLANEOUS) ×4 IMPLANT
DERMABOND ADVANCED (GAUZE/BANDAGES/DRESSINGS) ×1
DERMABOND ADVANCED .7 DNX12 (GAUZE/BANDAGES/DRESSINGS) ×3 IMPLANT
DRAPE ARM DVNC X/XI (DISPOSABLE) ×12 IMPLANT
DRAPE COLUMN DVNC XI (DISPOSABLE) ×3 IMPLANT
DRAPE DA VINCI XI ARM (DISPOSABLE) ×16
DRAPE DA VINCI XI COLUMN (DISPOSABLE) ×4
DRAPE UTILITY XL STRL (DRAPES) ×4 IMPLANT
DURAPREP 26ML APPLICATOR (WOUND CARE) ×4 IMPLANT
ELECT REM PT RETURN 9FT ADLT (ELECTROSURGICAL) ×4
ELECTRODE REM PT RTRN 9FT ADLT (ELECTROSURGICAL) ×3 IMPLANT
GAUZE 4X4 16PLY ~~LOC~~+RFID DBL (SPONGE) ×7 IMPLANT
GLOVE BIO SURGEON STRL SZ 6 (GLOVE) ×8 IMPLANT
GLOVE BIO SURGEON STRL SZ7 (GLOVE) ×15 IMPLANT
GLOVE BIOGEL PI IND STRL 6.5 (GLOVE) ×9 IMPLANT
GLOVE BIOGEL PI INDICATOR 6.5 (GLOVE) ×3
IRRIG SUCT STRYKERFLOW 2 WTIP (MISCELLANEOUS) ×4
IRRIGATION SUCT STRKRFLW 2 WTP (MISCELLANEOUS) ×3 IMPLANT
KIT TURNOVER CYSTO (KITS) ×4 IMPLANT
LEGGING LITHOTOMY PAIR STRL (DRAPES) ×4 IMPLANT
MANIPULATOR ADVINCU DEL 3.5 PL (MISCELLANEOUS) ×1 IMPLANT
NEEDLE INSUFFLATION 120MM (ENDOMECHANICALS) ×4 IMPLANT
OBTURATOR OPTICAL STANDARD 8MM (TROCAR) ×4
OBTURATOR OPTICAL STND 8 DVNC (TROCAR) ×3
OBTURATOR OPTICALSTD 8 DVNC (TROCAR) ×3 IMPLANT
PACK ROBOT WH (CUSTOM PROCEDURE TRAY) ×4 IMPLANT
PACK ROBOTIC GOWN (GOWN DISPOSABLE) ×4 IMPLANT
PACK TRENDGUARD 450 HYBRID PRO (MISCELLANEOUS) IMPLANT
PAD OB MATERNITY 4.3X12.25 (PERSONAL CARE ITEMS) ×4 IMPLANT
PAD PREP 24X48 CUFFED NSTRL (MISCELLANEOUS) ×4 IMPLANT
SEAL CANN UNIV 5-8 DVNC XI (MISCELLANEOUS) ×9 IMPLANT
SEAL XI 5MM-8MM UNIVERSAL (MISCELLANEOUS) ×12
SET IRRIG Y TYPE TUR BLADDER L (SET/KITS/TRAYS/PACK) ×4 IMPLANT
SET TRI-LUMEN FLTR TB AIRSEAL (TUBING) ×4 IMPLANT
SLEEVE SURGEON STRL (DRAPES) ×1 IMPLANT
SUT VIC AB 0 CT1 36 (SUTURE) ×4 IMPLANT
SUT VICRYL RAPIDE 3 0 (SUTURE) ×8 IMPLANT
SUT VLOC 180 0 6IN GS21 (SUTURE) ×1 IMPLANT
TOWEL OR 17X26 10 PK STRL BLUE (TOWEL DISPOSABLE) ×4 IMPLANT
TRAY FOLEY W/BAG SLVR 14FR LF (SET/KITS/TRAYS/PACK) ×4 IMPLANT
TRENDGUARD 450 HYBRID PRO PACK (MISCELLANEOUS) ×4
TROCAR PORT AIRSEAL 5X120 (TROCAR) ×4 IMPLANT
WATER STERILE IRR 500ML POUR (IV SOLUTION) ×1 IMPLANT

## 2021-06-25 NOTE — Anesthesia Postprocedure Evaluation (Signed)
Anesthesia Post Note  Patient: Theresa Schwartz  Procedure(s) Performed: XI ROBOTIC ASSISTED TOTAL HYSTERECTOMY WITH BILATERAL SALPINGECTOMIES (Bilateral) CYSTOSCOPY XI ROBOTIC ASSISTED LAPAROSCOPIC EXTENSIVE LYSIS OF ADHESIONS     Patient location during evaluation: PACU Anesthesia Type: General Level of consciousness: awake and alert Pain management: pain level controlled Vital Signs Assessment: post-procedure vital signs reviewed and stable Respiratory status: spontaneous breathing, nonlabored ventilation and respiratory function stable Cardiovascular status: blood pressure returned to baseline Postop Assessment: no apparent nausea or vomiting Anesthetic complications: no   No notable events documented.  Last Vitals:  Vitals:   06/25/21 1215 06/25/21 1230  BP: 122/81 118/75  Pulse: 74 66  Resp: 15 10  Temp:    SpO2: 100% 100%    Last Pain:  Vitals:   06/25/21 1215  TempSrc:   PainSc: [place]

## 2021-06-25 NOTE — Brief Op Note (Signed)
06/25/2021  11:10 AM  PATIENT:  Theresa Schwartz  51 y.o. female  PRE-OPERATIVE DIAGNOSIS:  abnormal uterine bleeding, uterine leiomyomas  POST-OPERATIVE DIAGNOSIS:  abnormal uterine bleeding, uterine leiomyomas  PROCEDURE:  Procedure(s): XI ROBOTIC ASSISTED TOTAL HYSTERECTOMY WITH BILATERAL SALPINGECTOMIES (Bilateral) CYSTOSCOPY (N/A) XI ROBOTIC ASSISTED LAPAROSCOPIC EXTENSIVE LYSIS OF ADHESIONS  SURGEON:  Surgeon(s) and Role:    * Rowland Lathe, MD - Primary    * Bobbye Charleston, MD - Assisting  ANESTHESIA:   local and general  EBL:  100 mL   BLOOD ADMINISTERED:none  DRAINS: Urinary Catheter (Foley)   LOCAL MEDICATIONS USED:  OTHER ropivicaine 10cc  SPECIMEN:  Source of Specimen:  uterus, cervix, bilateral fallopian tubes  DISPOSITION OF SPECIMEN:  PATHOLOGY  COUNTS:  YES  TOURNIQUET:  * No tourniquets in log *  DICTATION: .Note written in EPIC  PLAN OF CARE: Admit for overnight observation  PATIENT DISPOSITION:  PACU - hemodynamically stable.   Delay start of Pharmacological VTE agent (>24hrs) due to surgical blood loss or risk of bleeding: not applicable

## 2021-06-25 NOTE — Op Note (Signed)
OPERATIVE NOTE   06/25/2021   11:10 AM   PATIENT:  Theresa Schwartz  51 y.o. female   PRE-OPERATIVE DIAGNOSIS:  abnormal uterine bleeding, uterine leiomyomas   POST-OPERATIVE DIAGNOSIS:  abnormal uterine bleeding, uterine leiomyomas   PROCEDURE:  Procedure(s): XI ROBOTIC ASSISTED TOTAL HYSTERECTOMY WITH BILATERAL SALPINGECTOMIES (Bilateral) CYSTOSCOPY (N/A) XI ROBOTIC ASSISTED LAPAROSCOPIC EXTENSIVE LYSIS OF ADHESIONS   SURGEON:  Surgeon(s) and Role:    * Rowland Lathe, MD - Primary    * Bobbye Charleston, MD - Assisting   ANESTHESIA:   local and general   EBL:  100 mL    BLOOD ADMINISTERED:none   DRAINS: Urinary Catheter (Foley)    LOCAL MEDICATIONS USED:  OTHER ropivicaine 10cc   SPECIMEN:  Source of Specimen:  uterus, cervix, bilateral fallopian tubes   DISPOSITION OF SPECIMEN:  PATHOLOGY   COUNTS:  YES   PLAN OF CARE: Admit for overnight observation   PATIENT DISPOSITION:  PACU - hemodynamically stable.   COMPLICATIONS: NONE  FINDINGS:  On bimanual exam, 11 week size uterus with palpable posterior fibroid.  On laparoscopic view, extensive omental adhesions to anterior abdominal wall and anterior uterus. Enlarged uterus with multiple fibroids, dominant fibroid 4cm posterior. Thick adhesions from bladder to anterior uterus. Adhesions to right fallopian tube, otherwise normal appearing fallopian tubes and ovaries. Normal appearing ureters bilaterally.  Cystoscopy demonstrated intact bladder and bilateral ureteral jets.    DESCRIPTION OF PROCEDURE: After consent was verified the patient was taken to the operating room.  After adequate anesthesia was achieved the patient was positioned in the dorsal lithotomy position with legs in stirrups. SCDs were in place and cycling.  Exam revealed a 11 week size multifibroid uterus. The patient was prepped and draped in normal sterile fashion. Ancef 2g was given for infection prophylaxis. A speculum was placed in the vagina  and the anterior lip of the cervix was grasped with a tenaculum. A stay suture was placed on the anterior lip of the cervix. The uterus was sounded to 8cm and the cervix dilated with [facility] dilators.  The 3.5 cm delineating ring was assembled and placed in the proper fashion.  The speculum was removed and the bladder was drained with a Foley catheter.   Attention was turned to the abdomen. An 2m incision was made at the umbilicus and the Veress needle was inserted through the incision. Correct placement was confirmed by opening pressure of 275mg. The abdomen was insufflated with CO2 gas to a total pressure of 158m. The 8 mm robotic trochar was inserted into the abdominal cavity. The laparoscope was inserted and confirmed no vascular or visceral trauma from entry. The patient was placed in steep Trendelenburg.  Two additional 8mm34mbotic trochars were placed, one on each side of the abdomen. An 5mm 77mseal assistant port was placed on the left side. All trochars were inserted under direct visualization of the camera.  The robot was docked.  The fenestrated bipolar was placed on arm 1 and the monopolar scissors on arm 3 and introduced under direct visualization of the camera.   Survey of the abdomen revealed findings as noted above. Extensive omental adhesions to the anterior uterus and anterior abdominal wall precluded visualization of the pelvis. Approximately 45 min spent lysing omental adhesions. Good hemostasis appreciated.   With omental adhesions cleared, pelvis was inspected. Bilateral  ureters were visualized peristalsing. Right right fallopian tube was adhered to the right pelvic side wall. Thick bladder adhesions to the anterior uterus were noted. The  left mesosalpinx was serially ligated to free the left fallopian tube. The same was repeated on the right side. The left round ligament was transected. The same was repeated on the right side. The plane of the vesicouterine peritoneum was difficult to  appreciate anteriorly due to dense adhesions, so attention was turned posterior.The left posterior broad ligament was skeletonized posteriorly. The same process was repeated on the right side. This made the anterior planes clear, and the bladder was dissected off the uterus. The left uterine vessels were dessicated with bipolar cautery and transected with monopolar scissors. The right uterine vessels were transected in the same fashion. The cervicovaginal junction was identified with the delineating ring and the monopolar scissors were used to enter the vagina and incise circumferentially around the cervicovaginal junction. The uterus was not easily removed through the vagina due to the posterior fibroid. The cervix was therefore separated from the uterus, so that the angle of the uterus could be adjusted. The uterus, cervix, bilateral fallopian tubes were then removed through the vagina and sent for pathology. The vaginal cuff was closed with V loc suture in a running fashion. The suture was removed. An assistant palpated the vaginal cuff from the vaginal field and confirmed the vaginal cuff was intact without defect. The abdomen was irrigated and suctioned. The pelvis was observed to be hemostatic at 63mHg and 74mg. The robot was undocked. All ports were removed.  Fluorescein was administered.    The port sites were closed with 3-0 vicryl in a subcuticular fashion and covered with skin glue.  The foley catheter was removed. A cystoscope was inserted into the bladder and the bladder was distended with saline. Survey revealed intact bladder and bilateral ureteral jets were appreciated. The bladder was drained and foley reintroduced. A speculum was inserted into the vagina and vaginal cuff appeared intact and hemostatic. A manual exam again confirmed the vaginal cuff was intact without defect.    The patient was awakened from anesthesia and taken to the recovery room in stable condition.  M.Luther RedoMD  06/25/21 11:21 AM

## 2021-06-25 NOTE — Anesthesia Procedure Notes (Signed)
Procedure Name: Intubation Date/Time: 06/25/2021 7:38 AM Performed by: Justice Rocher, CRNA Pre-anesthesia Checklist: Patient identified, Emergency Drugs available, Suction available, Patient being monitored and Timeout performed Patient Re-evaluated:Patient Re-evaluated prior to induction Oxygen Delivery Method: Circle system utilized Preoxygenation: Pre-oxygenation with 100% oxygen Induction Type: IV induction Ventilation: Mask ventilation without difficulty Laryngoscope Size: Mac and 3 Grade View: Grade II Tube type: Oral Tube size: 7.0 mm Number of attempts: 1 Airway Equipment and Method: Stylet and Oral airway Placement Confirmation: ETT inserted through vocal cords under direct vision, positive ETCO2, breath sounds checked- equal and bilateral and CO2 detector Secured at: 23 cm Tube secured with: Tape Dental Injury: Teeth and Oropharynx as per pre-operative assessment

## 2021-06-25 NOTE — Transfer of Care (Signed)
Immediate Anesthesia Transfer of Care Note  Patient: Theresa Schwartz  Procedure(s) Performed: Procedure(s) (LRB): XI ROBOTIC ASSISTED TOTAL HYSTERECTOMY WITH BILATERAL SALPINGECTOMIES (Bilateral) CYSTOSCOPY (N/A) XI ROBOTIC ASSISTED LAPAROSCOPIC EXTENSIVE LYSIS OF ADHESIONS  Patient Location: PACU  Anesthesia Type: General  Level of Consciousness: awake, sedated, patient cooperative and responds to stimulation  Airway & Oxygen Therapy: Patient Spontanous Breathing and Patient connected to [place] 02   Post-op Assessment: Report given to PACU RN, Post -op Vital signs reviewed and stable and Patient moving all extremities  Post vital signs: Reviewed and stable  Complications: No apparent anesthesia complications

## 2021-06-26 DIAGNOSIS — Z87891 Personal history of nicotine dependence: Secondary | ICD-10-CM | POA: Diagnosis not present

## 2021-06-26 DIAGNOSIS — D259 Leiomyoma of uterus, unspecified: Secondary | ICD-10-CM | POA: Diagnosis not present

## 2021-06-26 DIAGNOSIS — E039 Hypothyroidism, unspecified: Secondary | ICD-10-CM | POA: Diagnosis not present

## 2021-06-26 DIAGNOSIS — N888 Other specified noninflammatory disorders of cervix uteri: Secondary | ICD-10-CM | POA: Diagnosis not present

## 2021-06-26 DIAGNOSIS — N939 Abnormal uterine and vaginal bleeding, unspecified: Secondary | ICD-10-CM | POA: Diagnosis not present

## 2021-06-26 LAB — CBC
HCT: 35.8 % — ABNORMAL LOW (ref 36.0–46.0)
Hemoglobin: 12 g/dL (ref 12.0–15.0)
MCH: 30.7 pg (ref 26.0–34.0)
MCHC: 33.5 g/dL (ref 30.0–36.0)
MCV: 91.6 fL (ref 80.0–100.0)
Platelets: 277 10*3/uL (ref 150–400)
RBC: 3.91 MIL/uL (ref 3.87–5.11)
RDW: 12.6 % (ref 11.5–15.5)
WBC: 12.5 10*3/uL — ABNORMAL HIGH (ref 4.0–10.5)
nRBC: 0 % (ref 0.0–0.2)

## 2021-06-26 LAB — SURGICAL PATHOLOGY

## 2021-06-26 MED ORDER — OXYCODONE HCL 5 MG PO TABS
ORAL_TABLET | ORAL | Status: AC
  Filled 2021-06-26: qty 2

## 2021-06-26 MED ORDER — OXYCODONE HCL 5 MG PO TABS
ORAL_TABLET | ORAL | Status: AC
  Filled 2021-06-26: qty 1

## 2021-06-26 MED ORDER — DOCUSATE SODIUM 100 MG PO CAPS: 100.0000 mg | ORAL_CAPSULE | Freq: Two times a day (BID) | ORAL | 0 refills | Status: AC

## 2021-06-26 MED ORDER — KETOROLAC TROMETHAMINE 30 MG/ML IJ SOLN
INTRAMUSCULAR | Status: AC
  Filled 2021-06-26: qty 1

## 2021-06-26 MED ORDER — SIMETHICONE 80 MG PO CHEW
CHEWABLE_TABLET | ORAL | Status: AC
  Filled 2021-06-26: qty 1

## 2021-06-26 MED ORDER — HYDROMORPHONE HCL 1 MG/ML IJ SOLN
INTRAMUSCULAR | Status: AC
  Filled 2021-06-26: qty 1

## 2021-06-26 MED ORDER — ACETAMINOPHEN 500 MG PO TABS
ORAL_TABLET | ORAL | Status: AC
  Filled 2021-06-26: qty 2

## 2021-06-26 MED ORDER — ACETAMINOPHEN 325 MG PO TABS: 650.0000 mg | ORAL_TABLET | Freq: Four times a day (QID) | ORAL | 0 refills | Status: AC | PRN

## 2021-06-26 NOTE — Progress Notes (Signed)
GYN Progress Note  S: Bora is doing well this morning. Overnight, pain max was 7/10, decreased to 4/10 with oxycodone. She ambulated multiple times in hallway without issues. She is voiding and tolerating PO without nausea or vomiting. No flatus yet. No chest pain, shortness of breath, calf pain. No VB. Vaginal area feels sore. Blood pressures normal to slightly low after narcotic administration. No lightheadedness or dizziness.   O: Today's Vitals   06/25/21 2142 06/26/21 0010 06/26/21 0200 06/26/21 0529  BP: 105/69  (!) 99/52 (!) 94/59  Pulse: 60  64 71  Resp: 18  16   Temp: 98.4 F (36.9 C)  98.2 F (36.8 C) 98.1 F (36.7 C)  TempSrc:      SpO2: 97%  99% 97%  Weight:      Height:      PainSc: '6  7  4  4    '$ Body mass index is 27.38 kg/m.  General: no acute distress, resting in bed CVS: regular rate and rhythm Lungs: clear to auscultation bilaterally Abd: soft, minimally distended, appropriately tender to palpation Incisions: 4 lap port site incisions clean dry and intact with skin glue Ext: no calf edema or tenderness, SCDs on and cycling  CBC    Component Value Date/Time   WBC 12.5 (H) 06/26/2021 0009   RBC 3.91 06/26/2021 0009   HGB 12.0 06/26/2021 0009   HCT 35.8 (L) 06/26/2021 0009   PLT 277 06/26/2021 0009   MCV 91.6 06/26/2021 0009   MCH 30.7 06/26/2021 0009   MCHC 33.5 06/26/2021 0009   RDW 12.6 06/26/2021 0009   LYMPHSABS 2.0 06/23/2021 1052   MONOABS 0.6 06/23/2021 1052   EOSABS 0.3 06/23/2021 1052   BASOSABS 0.1 06/23/2021 1052    A/P: POD1 s/p RA-TLH, bilateral salpingectomy, cysto, LOA - Labs and vitals appropriate - Meeting postop milestones, stable for discharge home - Discussed postop expectations and discharge instructions. Call for fever, severe pain, heavy  vaginal bleeding or abnormal discharge, persistent nausea/vomiting, or any other concerns. Meds already picked up.  Luther Redo, MD 06/26/21 6:31 AM

## 2021-06-26 NOTE — Discharge Summary (Signed)
Physician Discharge Summary  Patient ID: Theresa Schwartz MRN: [id number] DOB/AGE: [birth year] 51 y.o.  Admit date: 06/25/2021 Discharge date: 06/26/2021  Admission Diagnoses: Abnormal uterine bleeding, uterine leiomyoma  Discharge Diagnoses:  Principal Problem:   H/O total hysterectomy Active Problems:   Hx of total hysterectomy   Discharged Condition: good  Hospital Course:  06/25/21 Admitted for scheduled procedure. Underwent robot assisted total laparoscopic hysterectomy, bilateral salpingectomy, cystoscopy, lysis of adhesions. Stayed overnight for extended recovery for pain control. Ambulated, voided, tolerated PO.  06/26/21 meeting postop milestones, stable for discharge home.   Consults:  none  Significant Diagnostic Studies: labs: postop hgb 12.0  Treatments: surgery  Discharge Exam: Blood pressure (!) 94/59, pulse 71, temperature 98.1 F (36.7 C), resp. rate 16, height '5\' 4"'$  (1.626 m), weight 72.3 kg, SpO2 97 %. General: no acute distress, resting in bed CVS: regular rate and rhythm Lungs: clear to auscultation bilaterally Abd: soft, minimally distended, appropriately tender to palpation Incisions: 4 lap port site incisions clean dry and intact with skin glue Ext: no calf edema or tenderness, SCDs on and cycling    Disposition: Discharge disposition: 01-Home or Self Care       Discharge Instructions     Call MD for:   Complete by: As directed    Period like vaginal bleeding or abnormal vaginal discharge   Call MD for:  difficulty breathing, headache or visual disturbances   Complete by: As directed    Call MD for:  extreme fatigue   Complete by: As directed    Call MD for:  hives   Complete by: As directed    Call MD for:  persistant dizziness or light-headedness   Complete by: As directed    Call MD for:  persistant nausea and vomiting   Complete by: As directed    Call MD for:  redness, tenderness, or signs of infection (pain, swelling, redness, odor  or green/yellow discharge around incision site)   Complete by: As directed    Call MD for:  severe uncontrolled pain   Complete by: As directed    Call MD for:  temperature >100.4   Complete by: As directed    Diet - low sodium heart healthy   Complete by: As directed    Driving Restrictions   Complete by: As directed    No driving while taking narcotics   Increase activity slowly   Complete by: As directed    Lifting restrictions   Complete by: As directed    No heavy lifting more than 15-20 lbs   No dressing needed   Complete by: As directed    Sexual Activity Restrictions   Complete by: As directed    Nothing in vagina x 6 weeks      Allergies as of 06/26/2021       Reactions   Cymbalta [duloxetine Hcl] Other (See Comments)   REACTION: "made me depressed"   Doxycycline Other (See Comments)   REACTION: :swollen hot throat; itching"   Tramadol Itching   Sulfa Antibiotics Other (See Comments)   REACTION: "told by mother had allergy"        Medication List     TAKE these medications    acetaminophen 325 MG tablet Commonly known as: TYLENOL Take 2 tablets (650 mg total) by mouth every 6 (six) hours as needed.   docusate sodium 100 MG capsule Commonly known as: COLACE Take 1 capsule (100 mg total) by mouth 2 (two) times daily.   ibuprofen 600  MG tablet Commonly known as: ADVIL Take 1 tablet (600 mg total) by mouth every 6 (six) hours as needed.   levothyroxine 25 MCG tablet Commonly known as: SYNTHROID Take 25 mcg by mouth daily.   metoprolol succinate 25 MG 24 hr tablet Commonly known as: TOPROL-XL as needed. PRN for irregular heartbeat/heart racing per pt   oxyCODONE 5 MG immediate release tablet Commonly known as: Oxy IR/ROXICODONE Take 1-2 tablets (5-10 mg total) by mouth every 4 (four) hours as needed for moderate pain or severe pain.   rosuvastatin 10 MG tablet Commonly known as: CRESTOR Take 10 mg by mouth daily.   SUMAtriptan 50 MG  tablet Commonly known as: IMITREX sumatriptan 50 mg tablet  TAKE 1 TABLET BY MOUTH AS NEEDED FOR HEADACHE CAN REPEAT ONCE IN 2 3 HOURS IF NEEDED.   triamterene-hydrochlorothiazide 37.5-25 MG capsule Commonly known as: Dyazide Take 1 each (1 capsule total) by mouth daily.   Vitamin D3 25 MCG (1000 UT) Caps Take 1,000 Units by mouth daily.               Discharge Care Instructions  (From admission, onward)           Start     Ordered   06/26/21 0000  No dressing needed        06/26/21 8315            Follow-up Information     Rowland Lathe, MD Follow up.   Specialty: Obstetrics and Gynecology Why: 2 weeks for postop visit Contact information: [address] [place] [address] [phone]                 Signed: Rowland Lathe 06/26/2021, 6:36 AM

## 2021-06-27 ENCOUNTER — Encounter (HOSPITAL_BASED_OUTPATIENT_CLINIC_OR_DEPARTMENT_OTHER): Payer: Self-pay | Admitting: Obstetrics and Gynecology

## 2021-07-04 DIAGNOSIS — R300 Dysuria: Secondary | ICD-10-CM | POA: Diagnosis not present

## 2021-07-04 DIAGNOSIS — N760 Acute vaginitis: Secondary | ICD-10-CM | POA: Diagnosis not present

## 2021-07-07 ENCOUNTER — Other Ambulatory Visit: Payer: Self-pay | Admitting: Obstetrics and Gynecology

## 2021-07-08 ENCOUNTER — Ambulatory Visit
Admission: RE | Admit: 2021-07-08 | Discharge: 2021-07-08 | Disposition: A | Discharge status: Home / Self Care | Payer: 59 | Source: Ambulatory Visit | Attending: Obstetrics and Gynecology | Admitting: Obstetrics and Gynecology

## 2021-07-08 ENCOUNTER — Other Ambulatory Visit: Payer: Self-pay | Admitting: Obstetrics and Gynecology

## 2021-07-08 DIAGNOSIS — G8918 Other acute postprocedural pain: Secondary | ICD-10-CM

## 2021-07-08 MED ORDER — IOPAMIDOL (ISOVUE-300) INJECTION 61%
100.0000 mL | Freq: Once | INTRAVENOUS | Status: AC | PRN
  Administered 2021-07-08: 100 mL via INTRAVENOUS

## 2021-09-03 DIAGNOSIS — M7672 Peroneal tendinitis, left leg: Secondary | ICD-10-CM | POA: Diagnosis not present

## 2021-09-03 DIAGNOSIS — M25562 Pain in left knee: Secondary | ICD-10-CM | POA: Diagnosis not present

## 2021-09-03 DIAGNOSIS — M25572 Pain in left ankle and joints of left foot: Secondary | ICD-10-CM | POA: Diagnosis not present

## 2021-09-03 DIAGNOSIS — M19072 Primary osteoarthritis, left ankle and foot: Secondary | ICD-10-CM | POA: Diagnosis not present

## 2021-10-10 DIAGNOSIS — M25562 Pain in left knee: Secondary | ICD-10-CM | POA: Diagnosis not present

## 2021-10-27 DIAGNOSIS — M25562 Pain in left knee: Secondary | ICD-10-CM | POA: Diagnosis not present

## 2021-12-08 DIAGNOSIS — Z1211 Encounter for screening for malignant neoplasm of colon: Secondary | ICD-10-CM | POA: Diagnosis not present

## 2021-12-08 DIAGNOSIS — D125 Benign neoplasm of sigmoid colon: Secondary | ICD-10-CM | POA: Diagnosis not present

## 2021-12-08 DIAGNOSIS — D123 Benign neoplasm of transverse colon: Secondary | ICD-10-CM | POA: Diagnosis not present

## 2022-04-28 DIAGNOSIS — E559 Vitamin D deficiency, unspecified: Secondary | ICD-10-CM | POA: Diagnosis not present

## 2022-04-28 DIAGNOSIS — E785 Hyperlipidemia, unspecified: Secondary | ICD-10-CM | POA: Diagnosis not present

## 2022-04-28 DIAGNOSIS — M25562 Pain in left knee: Secondary | ICD-10-CM | POA: Diagnosis not present

## 2022-07-28 DIAGNOSIS — L259 Unspecified contact dermatitis, unspecified cause: Secondary | ICD-10-CM | POA: Diagnosis not present

## 2022-07-28 DIAGNOSIS — K130 Diseases of lips: Secondary | ICD-10-CM | POA: Diagnosis not present

## 2022-10-28 DIAGNOSIS — H6590 Unspecified nonsuppurative otitis media, unspecified ear: Secondary | ICD-10-CM | POA: Diagnosis not present

## 2022-10-28 DIAGNOSIS — Z79899 Other long term (current) drug therapy: Secondary | ICD-10-CM | POA: Diagnosis not present

## 2022-10-28 DIAGNOSIS — E785 Hyperlipidemia, unspecified: Secondary | ICD-10-CM | POA: Diagnosis not present

## 2022-10-28 DIAGNOSIS — Z23 Encounter for immunization: Secondary | ICD-10-CM | POA: Diagnosis not present

## 2023-03-12 DIAGNOSIS — Z01419 Encounter for gynecological examination (general) (routine) without abnormal findings: Secondary | ICD-10-CM | POA: Diagnosis not present

## 2023-03-12 DIAGNOSIS — Z1231 Encounter for screening mammogram for malignant neoplasm of breast: Secondary | ICD-10-CM | POA: Diagnosis not present

## 2023-04-16 DIAGNOSIS — B349 Viral infection, unspecified: Secondary | ICD-10-CM | POA: Diagnosis not present

## 2023-04-16 DIAGNOSIS — Z03818 Encounter for observation for suspected exposure to other biological agents ruled out: Secondary | ICD-10-CM | POA: Diagnosis not present

## 2023-06-14 DIAGNOSIS — Z0000 Encounter for general adult medical examination without abnormal findings: Secondary | ICD-10-CM | POA: Diagnosis not present

## 2023-12-16 ENCOUNTER — Other Ambulatory Visit (HOSPITAL_COMMUNITY): Payer: Self-pay | Admitting: Internal Medicine

## 2023-12-16 DIAGNOSIS — E559 Vitamin D deficiency, unspecified: Secondary | ICD-10-CM | POA: Diagnosis not present

## 2023-12-16 DIAGNOSIS — E785 Hyperlipidemia, unspecified: Secondary | ICD-10-CM

## 2023-12-29 ENCOUNTER — Other Ambulatory Visit (HOSPITAL_BASED_OUTPATIENT_CLINIC_OR_DEPARTMENT_OTHER)

## 2024-01-12 ENCOUNTER — Ambulatory Visit (HOSPITAL_BASED_OUTPATIENT_CLINIC_OR_DEPARTMENT_OTHER)
Admission: RE | Admit: 2024-01-12 | Discharge: 2024-01-12 | Disposition: A | Discharge status: Home / Self Care | Payer: Self-pay | Source: Ambulatory Visit | Attending: Internal Medicine | Admitting: Internal Medicine

## 2024-01-12 DIAGNOSIS — E785 Hyperlipidemia, unspecified: Secondary | ICD-10-CM | POA: Insufficient documentation

## 2024-02-05 IMAGING — US US PELVIS COMPLETE WITH TRANSVAGINAL
1 series · 14 of 25 positions shown · non-contrast
Comparison: None

CLINICAL DATA: Uterine leiomyoma

EXAM:
TRANSABDOMINAL AND TRANSVAGINAL ULTRASOUND OF PELVIS
TECHNIQUE: Both transabdominal and transvaginal ultrasound examinations of the
pelvis were performed. Transabdominal technique was performed for
global imaging of the pelvis including uterus, ovaries, adnexal
regions, and pelvic cul-de-sac. It was necessary to proceed with
endovaginal exam following the transabdominal exam to visualize the
endometrium and ovaries.

[Series 1: us pelvis complete with transvaginal · 0.25mm/px · 80 acquisitions, 14 frames shown]
[im 1/80]
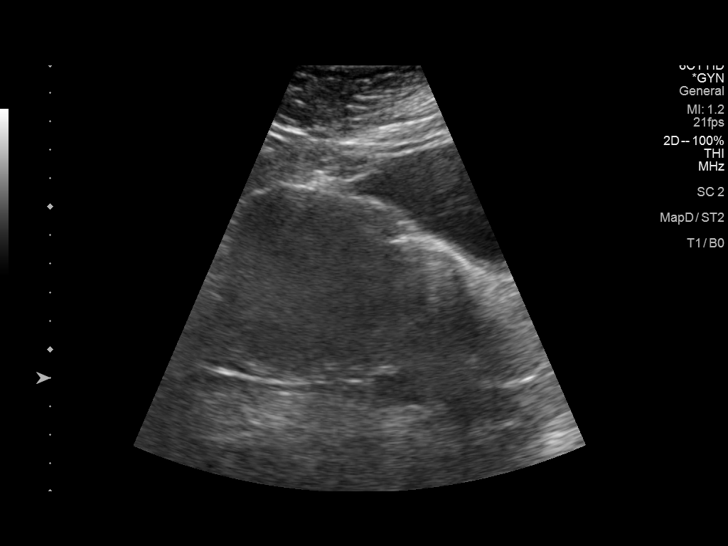
[im 7/80]
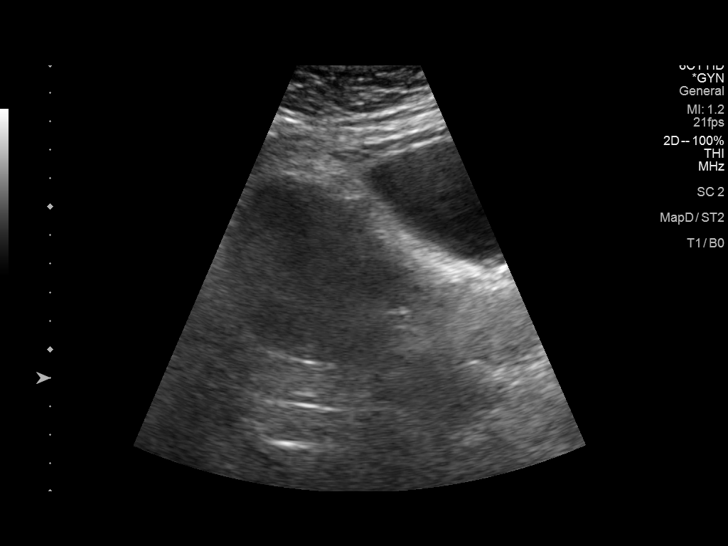
[im 14/80]
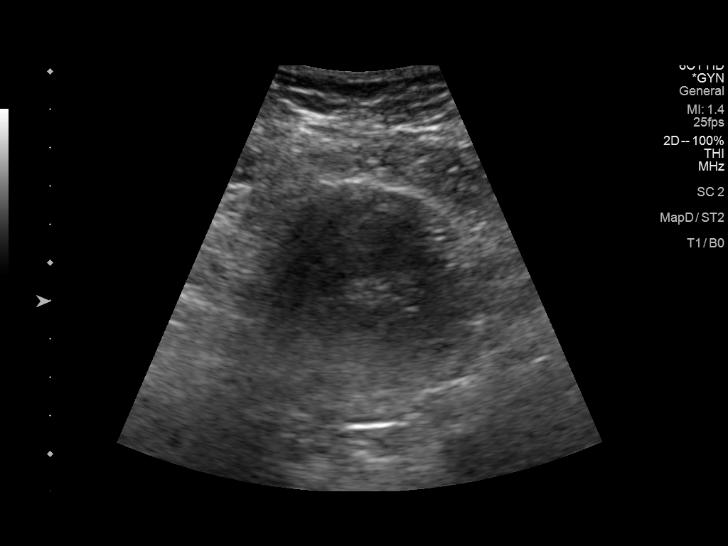
[im 20/80]
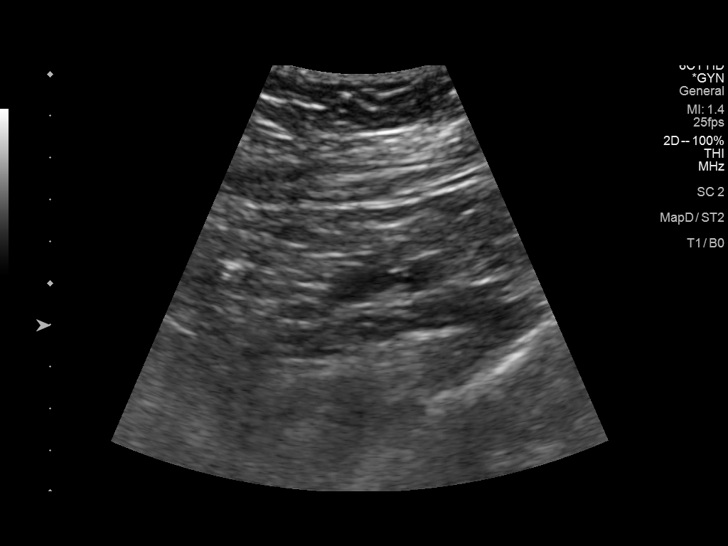
[im 27/80]
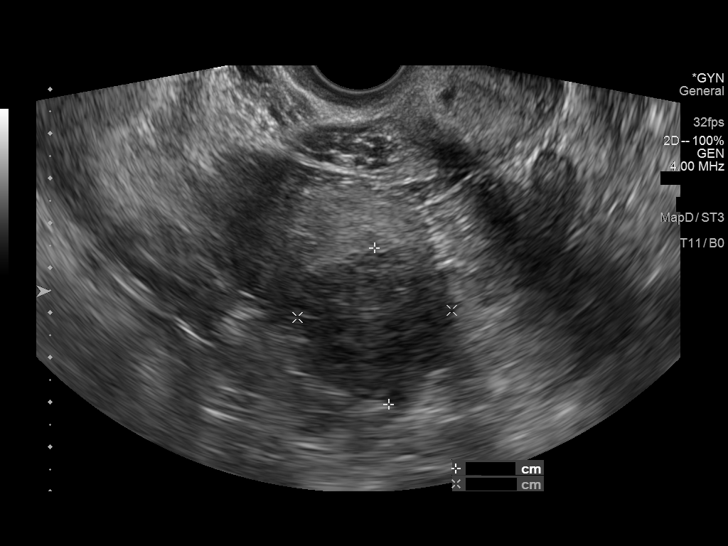
[im 30/80]
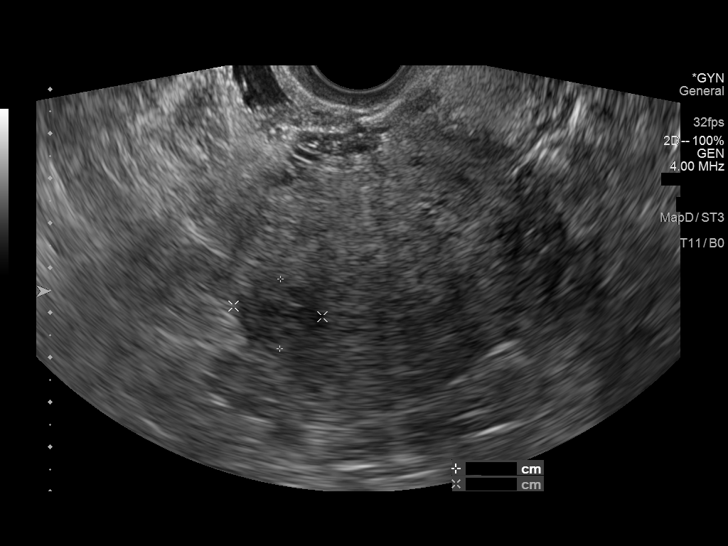
[im 37/80]
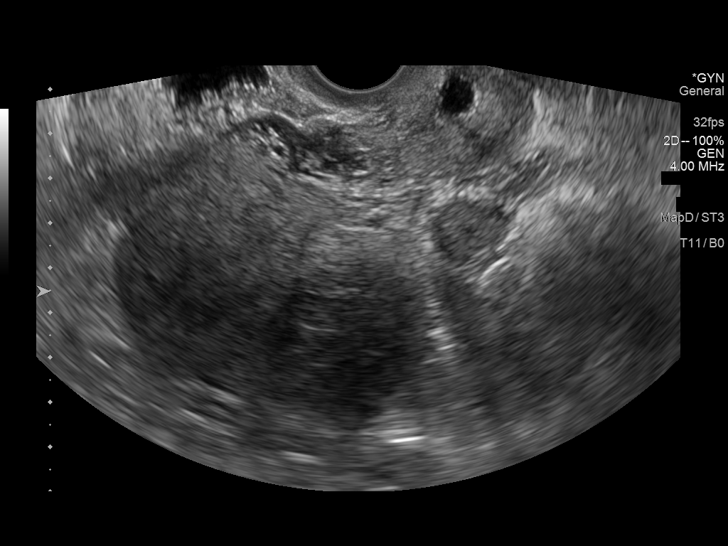
[im 43/80]
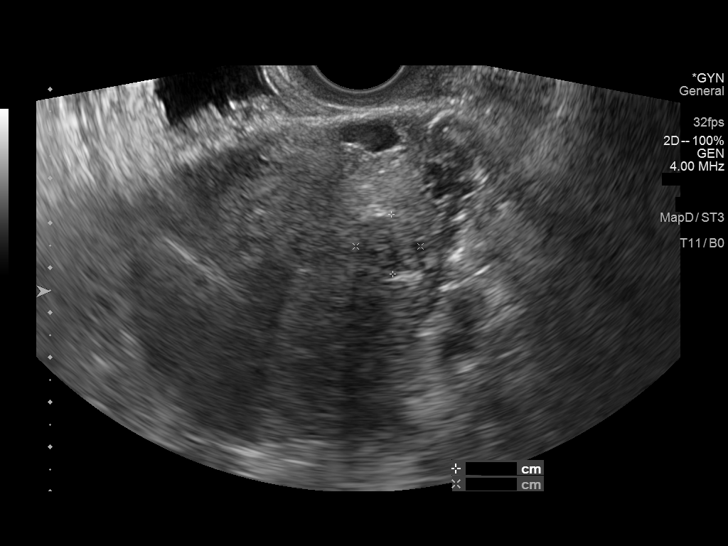
[im 50/80]
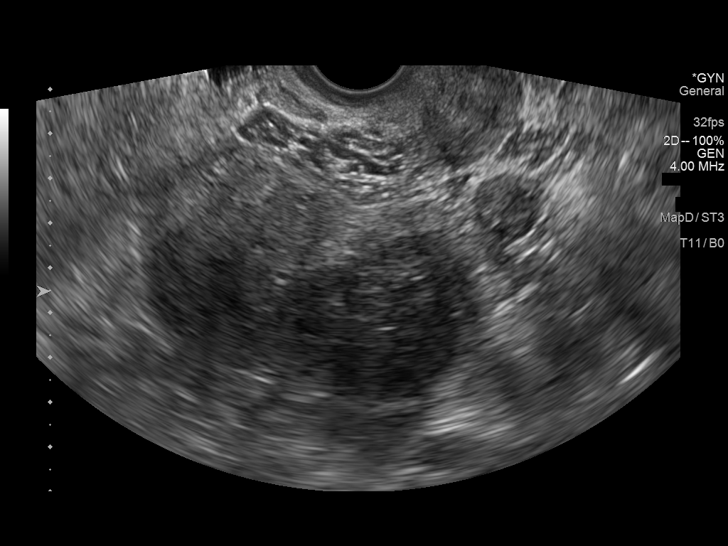
[im 53/80]
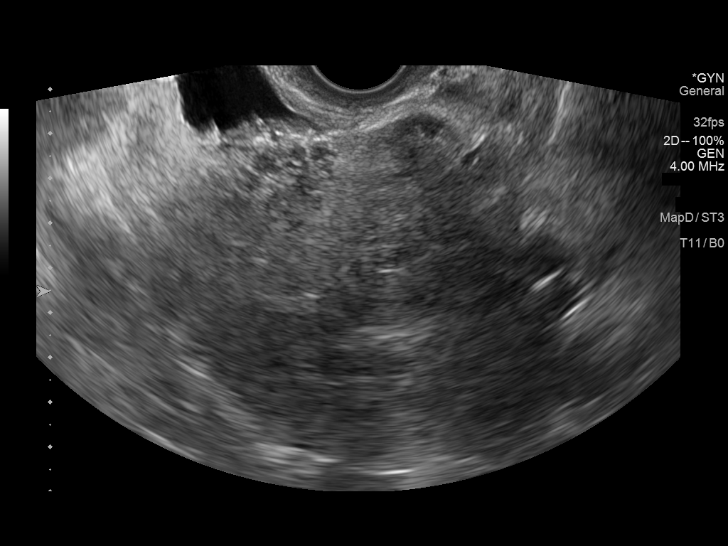
[im 60/80]
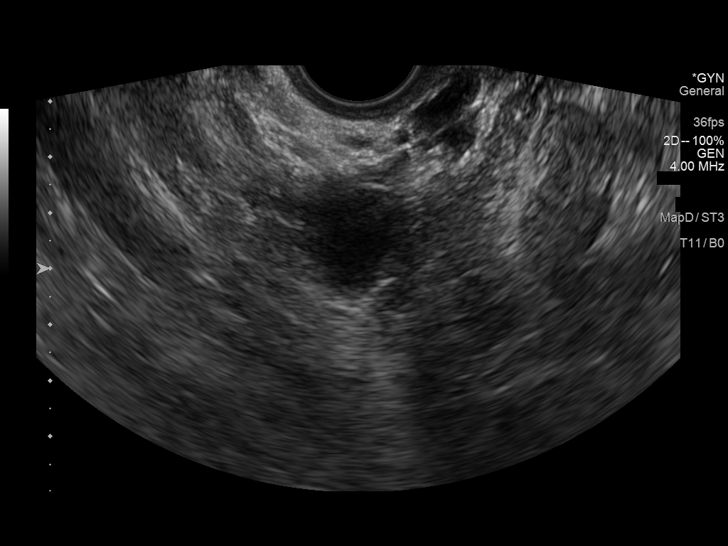
[im 66/80]
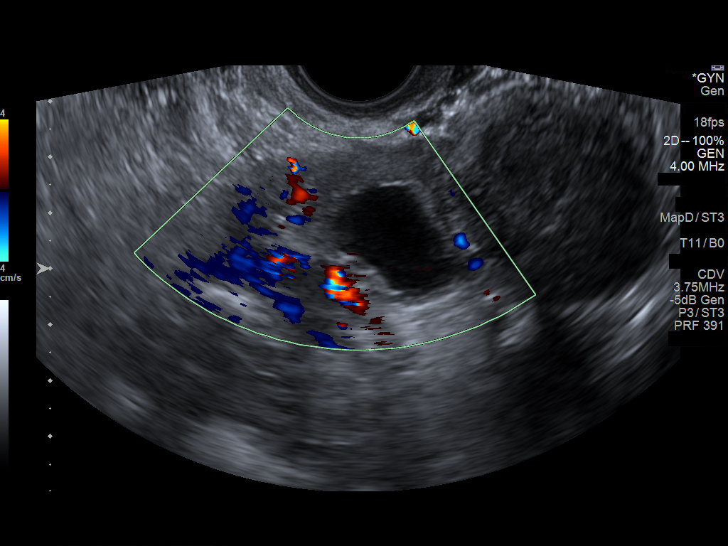
[im 73/80]
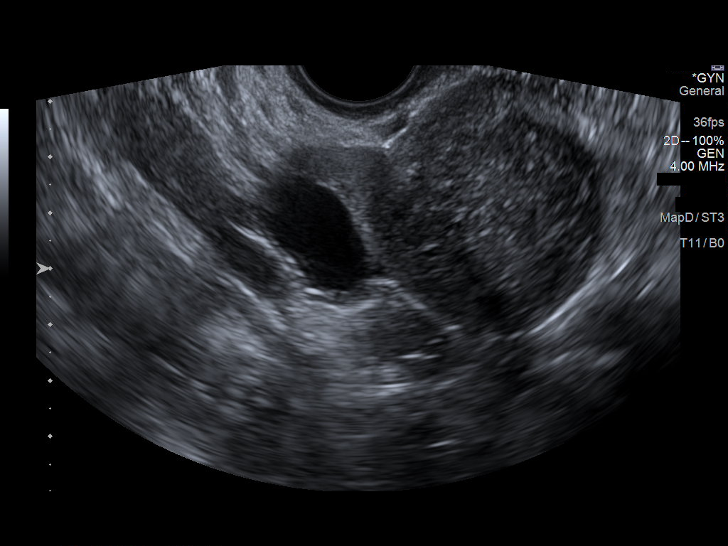
[im 80/80]
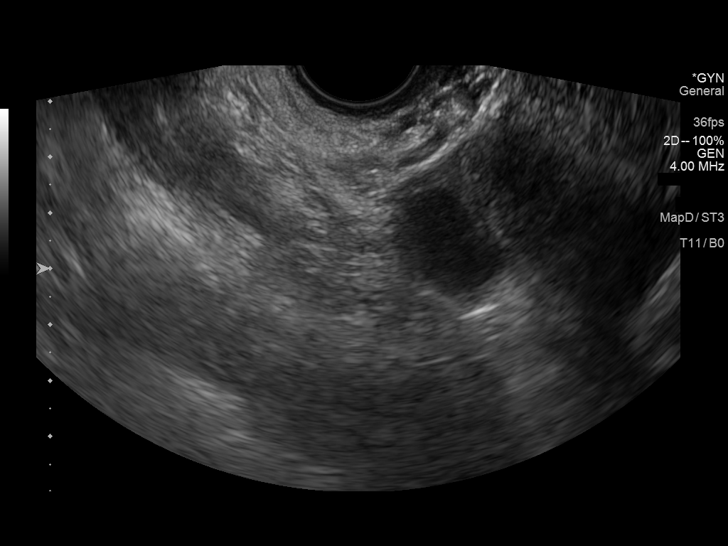

[14 of 25 positions shown; findings below may reference images not displayed]

FINDINGS: Uterus

Measurements: 11.1 x 6.2 x 7.6 cm = volume: 270 mL.

3 fibroids are noted.

Fibroid 1 is located in the posterior uterine body measuring 4.3 x
3.5 x 3.5 cm.

Fibroid 2 is located in the uterine fundus measuring 2.1 x 1.6 x
cm.

Fibroid 3 is located in the posterior uterine fundus measuring 1.5 x
1.3 x 1.5 cm.

Endometrium

Thickness: 5 mm.  No focal abnormality visualized.

Right ovary

Measurements: 4.6 x 2.7 x 2.5 cm = volume: 16.6 mL. 2.5 cm dominant
follicle is seen. Normal appearance.

Left ovary

Not visualized.

Other findings

No abnormal free fluid.
IMPRESSION: Fibroid uterus.

## 2024-04-30 IMAGING — CT CT ABD-PELV W/ CM
1 of 3 series · 12 of 32 positions shown, 17 images · IV contrast (APPLIED)
Comparison: Preoperative pelvis ultrasound 04/14/2021. CT Abdomen
and Pelvis 05/20/2015.

CLINICAL DATA: 50-year-old female status post hysterectomy
06/25/2021. ?Vaginal cuff cellulitis?. Query pelvic abscess. On
antibiotics.

EXAM:
CT ABDOMEN AND PELVIS WITH CONTRAST
TECHNIQUE: Multidetector CT imaging of the abdomen and pelvis was performed
using the standard protocol following bolus administration of
intravenous contrast.

[Series 2: abd/pelvis w/cm · axial · 0.72mm/px · z∈[+502,+912]mm · 12 of 96 slices shown, 17 images]
[im 7/96  soft-tissue]
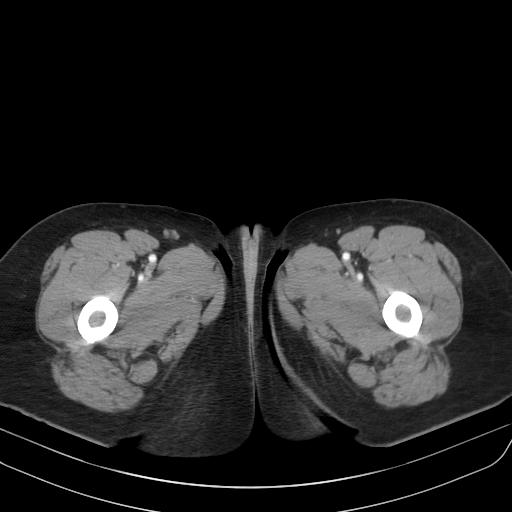
[im 7/96  bone]
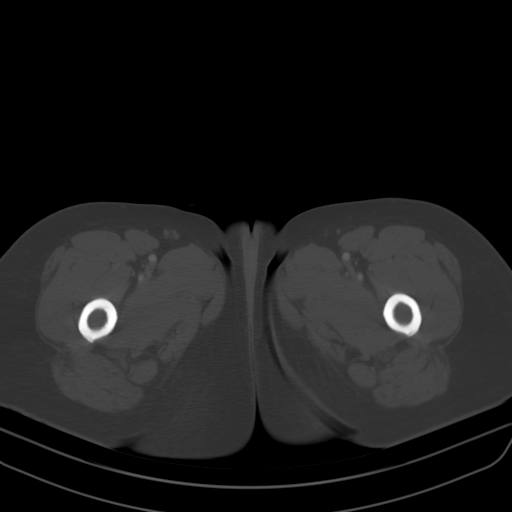
[im 14/96  soft-tissue]
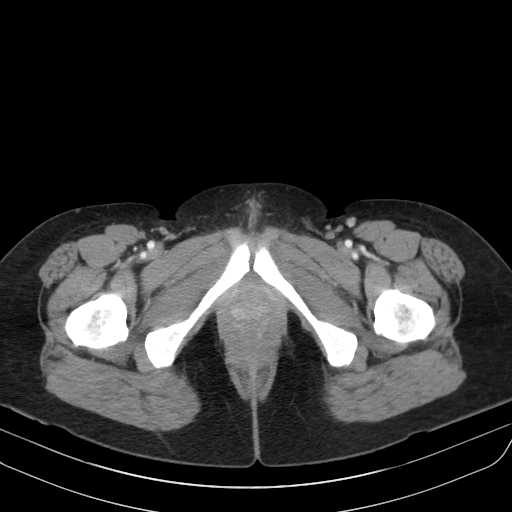
[im 21/96  soft-tissue]
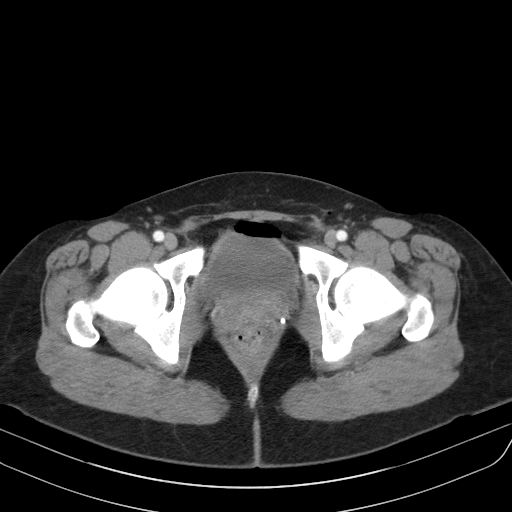
[im 34/96  soft-tissue]
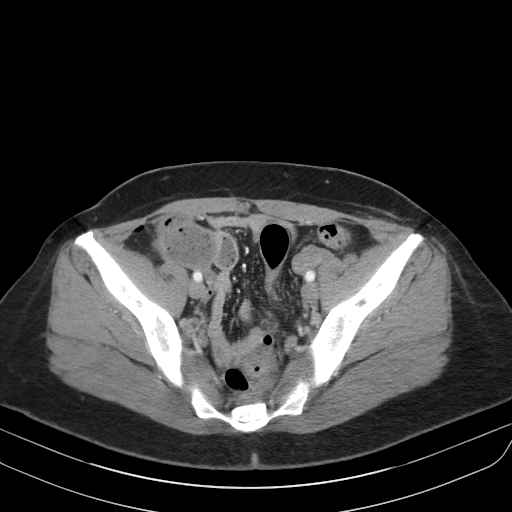
[im 41/96  soft-tissue]
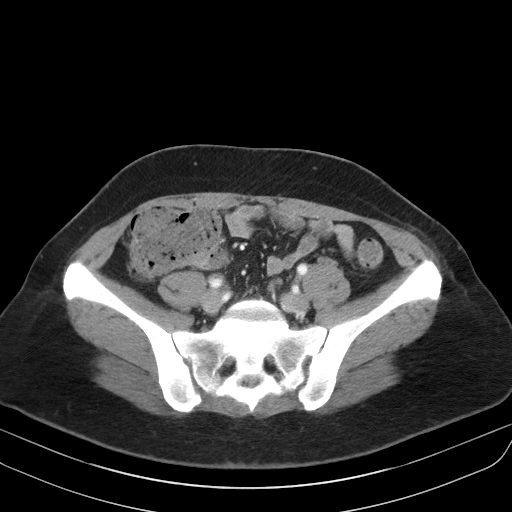
[im 48/96  soft-tissue]
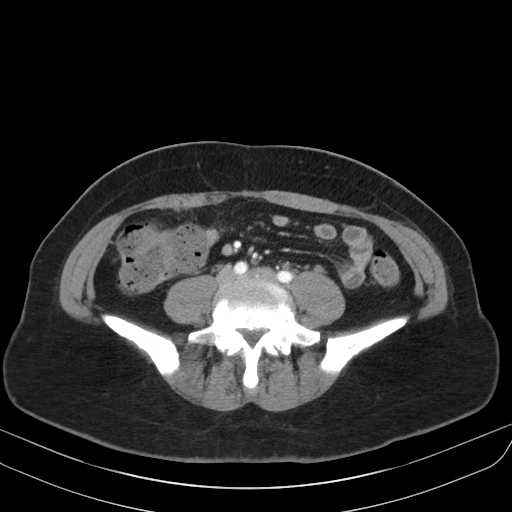
[im 55/96  soft-tissue]
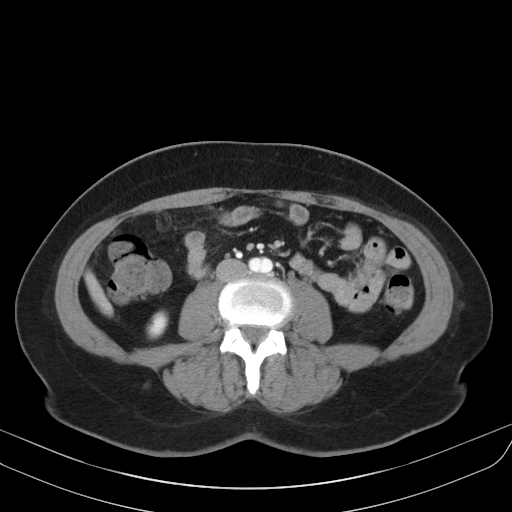
[im 62/96  soft-tissue]
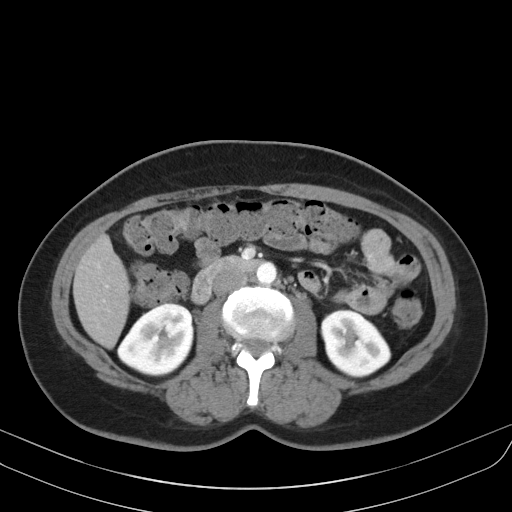
[im 68/96  lung]
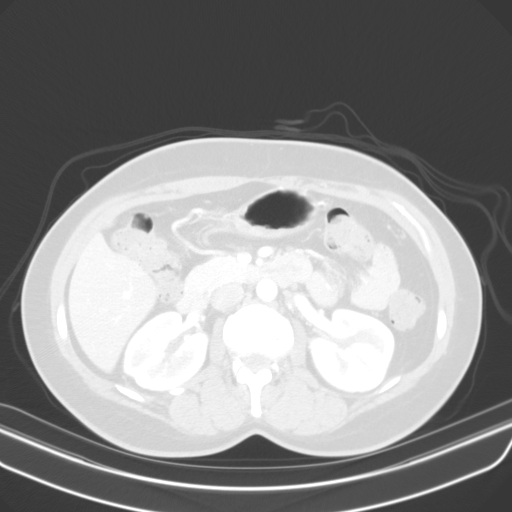
[im 75/96  soft-tissue]
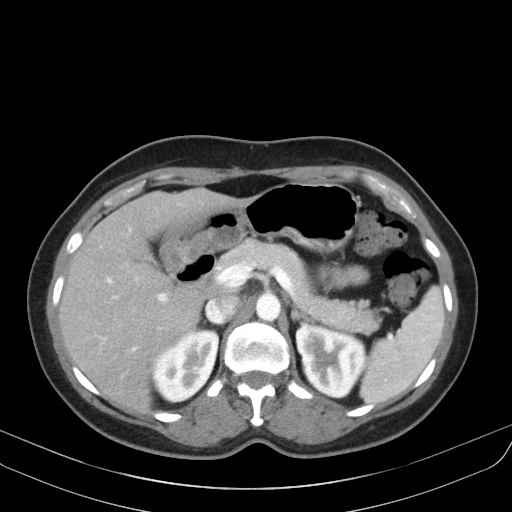
[im 75/96  lung]
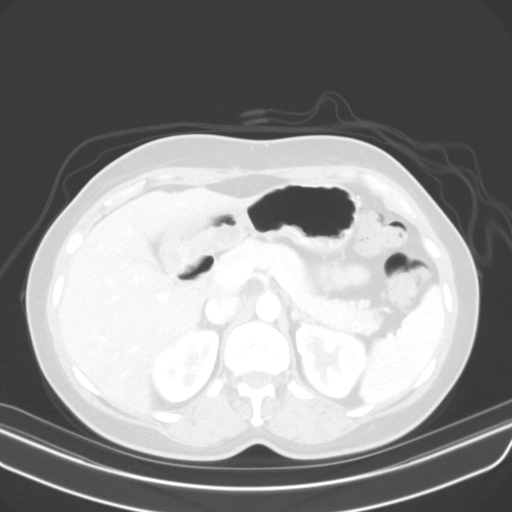
[im 75/96  bone]
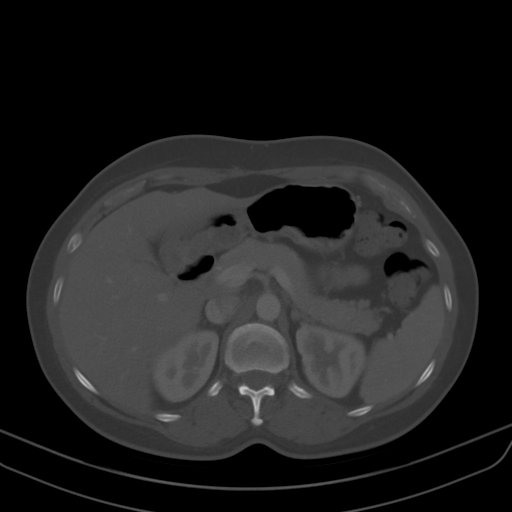
[im 82/96  soft-tissue]
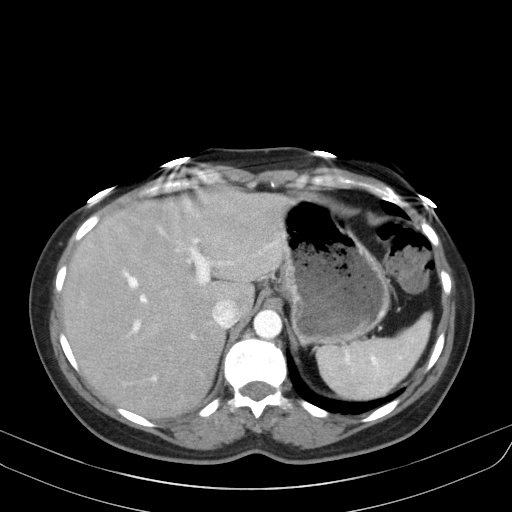
[im 82/96  lung]
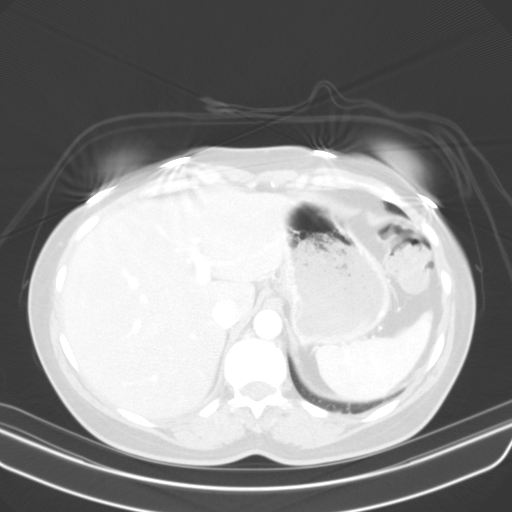
[im 89/96  soft-tissue]
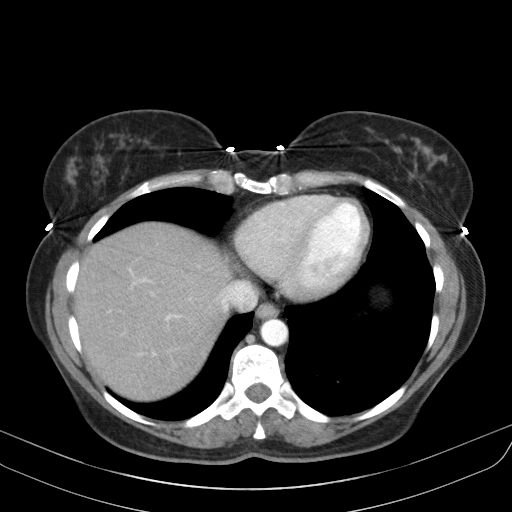
[im 89/96  lung]
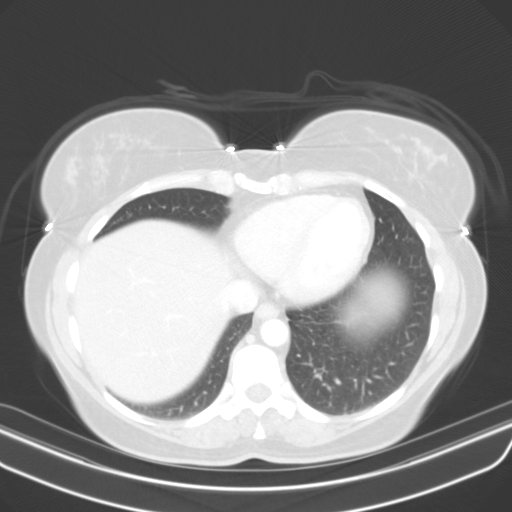

[12 of 32 positions shown; findings below may reference images not displayed]

RADIATION DOSE REDUCTION: This exam was performed according to the
departmental dose-optimization program which includes automated
exposure control, adjustment of the mA and/or kV according to
patient size and/or use of iterative reconstruction technique.

CONTRAST:  100mL [id number] IOPAMIDOL ([id number]) INJECTION 61%
FINDINGS: Lower chest: Negative; mildly lower lung volumes and minimal lung
base atelectasis. No pericardial or pleural effusion.

Hepatobiliary: Contracted gallbladder. Negative liver aside from
possible mild steatosis.

Pancreas: Negative.

Spleen: Negative.

Adrenals/Urinary Tract: Normal adrenal glands. Nonobstructed
kidneys. Symmetric renal enhancement and contrast excretion.
Occasional tiny left renal cysts (no follow-up imaging recommended).
Nondilated ureters. Diminutive urinary bladder, bordered by gas in
the space of Retzius, see below), but otherwise appears
unremarkable.

Stomach/Bowel: Mild retained stool in the rectum which otherwise
appears negative. Sigmoid colon is decompressed with no definite
inflammation. Redundant sigmoid in the left pelvis. Upstream mild to
moderate retained stool throughout the descending, transverse, right
colon. Normal appendix tracking into the right hemipelvis series 2,
image 61. No dilated small bowel. Mild gas and fluid containing
stomach. Decompressed duodenum.

No pneumoperitoneum identified. No free fluid.

Vascular/Lymphatic: Major arterial structures in the abdomen and
pelvis appear patent and normal. No lymphadenopathy. Portal venous
system appears to be patent.

Reproductive: Unremarkable CT appearance of the vagina and vaginal
cuff. Surgically absent uterus. Right ovary appears normal long the
pelvic side wall series 2, image 66. Left ovary appears normal along
the anterior left pelvic inlet image 63. No pelvic free fluid or
fluid collection is identified.

Other: There is trace gas in the left hemipelvis deep space in and
around the left operator foramen (series 3, image 48), but no
significant regional soft tissue inflammation. Similar trace gas at
the left inguinal ligament with nonenlarged adjacent lymph nodes
(series 2, image 75). And there is a larger plate-like 9 x 75 by 39
mm (AP by transverse by CC) collection of gas in the space of
Retzius (see sagittal image 53, series 2, image 73). There are
chronic pelvic phleboliths, mostly on the left. No significant
adjacent soft tissue inflammation.

Musculoskeletal: No acute osseous abnormality identified.
IMPRESSION: 1. Postoperative changes in the pelvis, most notably gas in the
Space of Retzius (13 mL) and the left operator foramen (trace). But
no fluid collection or abscess. And no significant soft tissue
inflammation identified.

2. No other acute or inflammatory process in the abdomen or pelvis.
Mild to moderate retained stool. Questionable hepatic steatosis.
